# Patient Record
Sex: Female | Born: 1945 | Race: White | Hispanic: No | Marital: Married | Smoking: Former smoker
Health system: Southern US, Community
[De-identification: ages and names within clinical notes are randomized; demographics above are authoritative.]

## PROBLEM LIST (undated history)

## (undated) DIAGNOSIS — R0602 Shortness of breath: Secondary | ICD-10-CM

## (undated) DIAGNOSIS — I10 Essential (primary) hypertension: Secondary | ICD-10-CM

## (undated) DIAGNOSIS — C189 Malignant neoplasm of colon, unspecified: Secondary | ICD-10-CM

## (undated) DIAGNOSIS — C55 Malignant neoplasm of uterus, part unspecified: Secondary | ICD-10-CM

## (undated) HISTORY — PX: PARTIAL COLECTOMY: SHX5273

## (undated) HISTORY — PX: ABDOMINAL HYSTERECTOMY: SHX81

## (undated) HISTORY — DX: Essential (primary) hypertension: I10

## (undated) HISTORY — PX: APPENDECTOMY: SHX54

## (undated) HISTORY — PX: TUBAL LIGATION: SHX77

---

## 1998-12-17 ENCOUNTER — Other Ambulatory Visit: Admission: RE | Admit: 1998-12-17 | Discharge: 1998-12-17 | Payer: Self-pay | Admitting: Obstetrics and Gynecology

## 1998-12-29 ENCOUNTER — Inpatient Hospital Stay (HOSPITAL_COMMUNITY): Admission: RE | Admit: 1998-12-29 | Discharge: 1998-12-31 | Payer: Self-pay | Admitting: Obstetrics and Gynecology

## 1999-06-29 ENCOUNTER — Ambulatory Visit (HOSPITAL_COMMUNITY): Admission: RE | Admit: 1999-06-29 | Discharge: 1999-06-29 | Payer: Self-pay | Admitting: Family Medicine

## 1999-06-29 ENCOUNTER — Encounter: Payer: Self-pay | Admitting: Family Medicine

## 2007-01-29 ENCOUNTER — Ambulatory Visit (HOSPITAL_COMMUNITY): Admission: RE | Admit: 2007-01-29 | Discharge: 2007-01-29 | Payer: Self-pay | Admitting: Gastroenterology

## 2007-01-29 ENCOUNTER — Encounter (INDEPENDENT_AMBULATORY_CARE_PROVIDER_SITE_OTHER): Payer: Self-pay | Admitting: Specialist

## 2007-03-21 ENCOUNTER — Encounter (INDEPENDENT_AMBULATORY_CARE_PROVIDER_SITE_OTHER): Payer: Self-pay | Admitting: Surgery

## 2007-03-21 ENCOUNTER — Inpatient Hospital Stay (HOSPITAL_COMMUNITY): Admission: RE | Admit: 2007-03-21 | Discharge: 2007-03-24 | Payer: Self-pay | Admitting: Surgery

## 2007-04-03 ENCOUNTER — Ambulatory Visit: Payer: Self-pay | Admitting: Hematology & Oncology

## 2007-04-19 LAB — COMPREHENSIVE METABOLIC PANEL
ALT: 16 U/L (ref 0–35)
AST: 18 U/L (ref 0–37)
Albumin: 4.4 g/dL (ref 3.5–5.2)
Alkaline Phosphatase: 82 U/L (ref 39–117)
BUN: 5 mg/dL — ABNORMAL LOW (ref 6–23)
CO2: 25 mEq/L (ref 19–32)
Calcium: 9.6 mg/dL (ref 8.4–10.5)
Chloride: 102 mEq/L (ref 96–112)
Creatinine, Ser: 0.84 mg/dL (ref 0.40–1.20)
Glucose, Bld: 87 mg/dL (ref 70–99)
Potassium: 4.1 mEq/L (ref 3.5–5.3)
Sodium: 139 mEq/L (ref 135–145)
Total Bilirubin: 0.5 mg/dL (ref 0.3–1.2)
Total Protein: 7.1 g/dL (ref 6.0–8.3)

## 2007-04-19 LAB — CBC WITH DIFFERENTIAL/PLATELET
BASO%: 0.4 % (ref 0.0–2.0)
Basophils Absolute: 0 10*3/uL (ref 0.0–0.1)
EOS%: 1.6 % (ref 0.0–7.0)
Eosinophils Absolute: 0.1 10*3/uL (ref 0.0–0.5)
HCT: 33.3 % — ABNORMAL LOW (ref 34.8–46.6)
HGB: 11.6 g/dL (ref 11.6–15.9)
LYMPH%: 27.3 % (ref 14.0–48.0)
MCH: 30.2 pg (ref 26.0–34.0)
MCHC: 34.8 g/dL (ref 32.0–36.0)
MCV: 87 fL (ref 81.0–101.0)
MONO#: 0.4 10*3/uL (ref 0.1–0.9)
MONO%: 6.6 % (ref 0.0–13.0)
NEUT#: 4.1 10*3/uL (ref 1.5–6.5)
NEUT%: 64.1 % (ref 39.6–76.8)
Platelets: 414 10*3/uL — ABNORMAL HIGH (ref 145–400)
RBC: 3.82 10*6/uL (ref 3.70–5.32)
RDW: 14 % (ref 11.3–14.5)
WBC: 6.3 10*3/uL (ref 3.9–10.0)
lymph#: 1.7 10*3/uL (ref 0.9–3.3)

## 2007-04-19 LAB — CEA: CEA: 1.4 ng/mL (ref 0.0–5.0)

## 2007-10-26 ENCOUNTER — Ambulatory Visit: Payer: Self-pay | Admitting: Hematology & Oncology

## 2007-10-26 LAB — CBC WITH DIFFERENTIAL/PLATELET
BASO%: 0.8 % (ref 0.0–2.0)
Basophils Absolute: 0.1 10*3/uL (ref 0.0–0.1)
EOS%: 2.1 % (ref 0.0–7.0)
Eosinophils Absolute: 0.2 10*3/uL (ref 0.0–0.5)
HCT: 38.5 % (ref 34.8–46.6)
HGB: 13.1 g/dL (ref 11.6–15.9)
LYMPH%: 35.4 % (ref 14.0–48.0)
MCH: 29 pg (ref 26.0–34.0)
MCHC: 34 g/dL (ref 32.0–36.0)
MCV: 85.1 fL (ref 81.0–101.0)
MONO#: 0.8 10*3/uL (ref 0.1–0.9)
MONO%: 8.9 % (ref 0.0–13.0)
NEUT#: 4.6 10*3/uL (ref 1.5–6.5)
NEUT%: 52.8 % (ref 39.6–76.8)
Platelets: 421 10*3/uL — ABNORMAL HIGH (ref 145–400)
RBC: 4.52 10*6/uL (ref 3.70–5.32)
RDW: 12.9 % (ref 11.3–14.5)
WBC: 8.7 10*3/uL (ref 3.9–10.0)
lymph#: 3.1 10*3/uL (ref 0.9–3.3)

## 2007-10-26 LAB — COMPREHENSIVE METABOLIC PANEL
ALT: 18 U/L (ref 0–35)
AST: 20 U/L (ref 0–37)
Albumin: 4.5 g/dL (ref 3.5–5.2)
Alkaline Phosphatase: 114 U/L (ref 39–117)
BUN: 14 mg/dL (ref 6–23)
CO2: 28 mEq/L (ref 19–32)
Calcium: 9.6 mg/dL (ref 8.4–10.5)
Chloride: 96 mEq/L (ref 96–112)
Creatinine, Ser: 0.81 mg/dL (ref 0.40–1.20)
Glucose, Bld: 94 mg/dL (ref 70–99)
Potassium: 4 mEq/L (ref 3.5–5.3)
Sodium: 138 mEq/L (ref 135–145)
Total Bilirubin: 0.4 mg/dL (ref 0.3–1.2)
Total Protein: 7.2 g/dL (ref 6.0–8.3)

## 2007-10-26 LAB — CEA: CEA: 1.7 ng/mL (ref 0.0–5.0)

## 2007-10-31 ENCOUNTER — Ambulatory Visit (HOSPITAL_COMMUNITY): Admission: RE | Admit: 2007-10-31 | Discharge: 2007-10-31 | Payer: Self-pay | Admitting: Hematology & Oncology

## 2010-10-24 ENCOUNTER — Encounter: Payer: Self-pay | Admitting: Hematology & Oncology

## 2011-02-15 NOTE — Discharge Summary (Signed)
NAME:  Rhonda Ellis, Rhonda Ellis              ACCOUNT NO.:  [id number]   MEDICAL RECORD NO.:  [id number]          PATIENT TYPE:  INP   LOCATION:  1533                         FACILITY:  [facility]   PHYSICIAN:  Ardeth Sportsman, MD     DATE OF BIRTH:  1946/07/30   DATE OF ADMISSION:  03/21/2007  DATE OF DISCHARGE:  03/24/2007                               DISCHARGE SUMMARY   PRIMARY CARE PHYSICIAN:  Marjory Lies, M.D.   PRIMARY GASTROENTEROLOGIST:  Anselmo Rod, M.D.   DIAGNOSIS:  Proximal rectal adenocarcinoma.   PROCEDURE:  Laparoscopic lysis of adhesions and low anterior resection  with splenic flexure mobilization on March 21, 2007.   CLINICAL DATA:  Pathology shows a T3N0 adenocarcinoma, 6 cm maximal  tumor size, moderately differentiated, 0 of 30 lymph nodes.   HOSPITAL COURSE:  Ms. Billiot is a 65 year old female who was found to  have a rectal cancer.  She underwent laparoscopic resection on March 21, 2007.  Postoperatively she progressed rapidly, developing flatus and  tolerating an oral diet.  She did not have any nausea or vomiting.  She  was transitioned over to oral pain medications and was ambulating well.  She had some anemia so she was started on some iron.  Based on these  improvements, we felt it would reasonable for her to be discharged home  with the following instructions.   DISCHARGE INSTRUCTIONS:  1. She is to return to the clinic to see me in about two weeks.  2. She should follow up with her primary care physician, Dr. Doristine Counter.  3. She may have discussion of need for medical oncology consultation      depending/based upon final pathology results.  4. She should call if she has any fevers, chills, sweats, nausea,      vomiting, worsening abdominal pain or discomfort.  5. She should resume her home medications which include:      a.     Iron p.o. b.i.d.      b.     Darvocet-N 100 one to two p.o. q.4-6h. p.r.n. pain.      c.     Loratadine p.r.n.      Ardeth Sportsman, MD  Electronically Signed     SCG/MEDQ  D:  04/23/2007  T:  04/24/2007  Job:  [id number]   cc:   Anselmo Rod, M.D.  Fax: [phone]   Marjory Lies, M.D.  Fax: [phone]

## 2011-02-15 NOTE — Op Note (Signed)
NAME:  Rhonda Ellis, Rhonda Ellis              ACCOUNT NO.:  [id number]   MEDICAL RECORD NO.:  [id number]          PATIENT TYPE:  INP   LOCATION:  1533                         FACILITY:  [facility]   PHYSICIAN:  Ardeth Sportsman, MD     DATE OF BIRTH:  [birth year]   DATE OF PROCEDURE:  03/21/2007  DATE OF DISCHARGE:                               OPERATIVE REPORT   SURGEON:  Ardeth Sportsman, M.D.   ASSISTANT:  Wilmon Arms. Tsuei, M.D.   PREOPERATIVE DIAGNOSIS:  Cancer at the sigmoid/rectal junction.   POSTOPERATIVE DIAGNOSIS:  Cancer at the sigmoid/rectal junction.   PROCEDURE PERFORMED:  1. Laparoscopic lysis of adhesions x60 minutes Dexon.  2. Laparoscopic splenic flexure mobilization of the colon.  3. Laparoscopic anterior resection with 33 EEA stapled anastomosis.  4. Rigid proctoscopy.   SPECIMEN:  Sigmoid colon and proximal rectum.   DRAINS:  None.   ESTIMATED BLOOD LOSS:  150 mL.   COMPLICATIONS:  No major complications.   ANESTHESIA:  1. General anesthesia.  2. Local anesthetic in a field block around all port sites as well as      subfascial block on the extraction site and the low midline.   INDICATIONS:  [name] is a 65 year old female who was found, on  screening colonoscopy, to have a mass 10-15 cm from the anus concerning  for a cancer at the sigmoid rectal junction.  CT scan felt it more  concerning in the distal sigmoid.  The options were discussed and  recommendations made for a segmental colonic resection with low anterior  resection to have adequate distal margins in the mid rectum.  She had no  evidence of any metastatic disease.  It does not seem to be fixed on any  structures, therefore, neoadjuvant chemotherapy did not seem to be  warranted.   The technique of laparoscopically assisted low anterior resection was  discussed.  The risks of stroke, heart attack, deep venous thrombosis,  pulmonary embolus, and death were discussed.  Risks such as bleeding,  transfusion, wound infection, abscess, injury to other organs,  anastomotic leak resulting in ileostomy, incisional hernia, prolonged  pain, and other risks were discussed.  Questions were answered and she  agreed to proceed.   OPERATIVE FINDINGS:  She had a long fibrous dense mass right at the  sigmoid rectal junction, but did not seem to be at any other sidewall  structures.  She had a moderate amount of midline adhesions.  She had no  evidence of any metastatic disease in her peritoneum, liver, or  other  organs.   DESCRIPTION OF PROCEDURE:  Informed consent was confirmed.  The patient  received IV cefoxitin just prior to induction.  She had sequential  compression devices active during the entire case.  She underwent  general anesthesia without difficulty.  She had a Foley catheter  sterilely placed.  She was positioned in low lithotomy with arms tucked  and care was made to try and keep her secure in the bed by lying on a  gel pad.  I went ahead and performed rigid proctoscopy after digital  inspection confirmed that this was proximal to my digit.  I could get at  about 12 cm by rigid proctoscopy.  I did encounter the start of a  proximal rectal mass.   Entry was gained in the abdomen with the patient in steep reversed  Trendelenburg right side up, using a 5 degree scope through a stab  incision in the right upper quadrant.  Careful inspection revealed a  moderate amount of abdominal adhesions.  I was able to place 5 mm ports  in the right mid abdomen and the left mid abdomen.  Later, a 12 mm port  was placed in the right lower quadrant and a 5 mm port was placed  through the umbilicus.  Sharp dissection was done with only very little  focus cautery to remove the numerous adhesions on the parietal  peritoneum of primarily omentum to her midline incision.  She also had  some adhesions of small bowel down in the pelvis from her prior  hysterectomy and appendectomy, but with careful  sharp dissection, I was  ultimately able to free all that up and reduce the small bowel out in  the abdomen.  She also had some attachments to her sigmoid colon toward  the lateral sidewall, but I left those intact to start.   The mesentery of the sigmoid colon was elevated anteriorly to help  identify the inferior mesenteric artery.  The mesentery of the sigmoid  colon was scored at the sacral promontory and followed slightly distally  and proximally over to the ligament of Treitz, taking care to avoid  injury to small bowel.  The sigmoid mesentery was elevated anteriorly to  get a mesenteric window.  The left ureter, gonadal vessels, and iliac  structures could easily be seen and these were left posterior and  preserved at all times.  Blunt dissection was done to help free the  mesentery off the retroperitoneal structures along the Toldt's fascia.  This was carried down towards the rectum and proximally towards the  splenic flexure.  The inferior mesenteric artery and vein could be  isolated, skeletonized, and ligated using a LigaSure within 2 cm from  the abdominal aorta and IVC, respectively.   The colon was freed from its lateral side attachments. Since medial and  lateral dissection had been done, lateral medial mobilization was  completed to help free it off its lateral sidewall attachments all the  way up to the splenic flexure.  The distal half of the transverse colon  was freed off the greater omentum and also splenocolic attachments were  freed off, as well, to provide excellent mobilization.  The spleen and  stomach were observed and protected at all times.  This provided good  mobilization.   Dissection was done just anterior to the sacrum for a total mesorectal  excision of the proximal half of the rectum, initially was done  posteriorly and then also took the lateral sidewall stocks, taking care  to avoid injury to ureter or other structures.  Dissection was carried   anteriorly and initially done sharply to help make sure that the  anterior wall if the rectum was free away from the cuff.  With this  dissection, the mesorectum was skeletonized and ligated and transection  was made using a laparoscopic stapler, primarily done with one full  staple load and then one extra staple just at the corner of the rectum  with a good result.   Capnoperitoneum was completely evacuated through the ports.  A  5 mL low  midline incision was made through a prior low midline incision and a  wound protector was placed.  The proximal rectum and sigmoid colon was  completely eviscerated with a good result.  The mass could easily be  felt and was about 3 cm proximal to the distal margin.  The inferior  mesenteric artery and vein were then taken.  The mesentery was taken in  a ray-like fashion in the distal descending colon and the colon was  transected and sent with the proximal side open.  EEA sizers were used  to size the proximal end of the descending colon and it barely allowed a  33.  A 33 EEA stapler was opened up and the anvil was placed in the  proximal end and a circumferential purse-string with 0 Prolene was done  not tied down.  Some large epiploic appendices were carefully  skeletonized off to have not over crowd the anvil.  The colon was  reduced back into the abdomen.  The wound protector was clamped off and  capnoperitoneum was reintroduced.  Inspection was done with no evidence  of any active bleeding and the mesentery seemed to lay well and the  anvil came easily down into the rectum without any tension.   Dr. Corliss Skains transitioned to the perineum.  After some gentle finger  dilation of the anus, the EEA stapler was advanced up into the rectal  stump.  Inspection was done sterilely of the vaginal wall cuff to make  sure that the vaginal cuff was appropriately dissected away with no  evidence of any leak or injury and it did not.  The spike was brought   through the rectal stump.  The anvil of the proximal descending colon  was attached to the stump and the stapler was fired and a 33 EEA  anastomosis was created.  Anastomotic rings were inspected and were  excellent and the distal ring was sent for the new distal margin.  Leak  test was tested, initially there were a little bit of bubbles when  starting to insufflate, but after reirrigation and reinspection, after  three more tests, there was no evidence of any leak or bubbles.  The  staple line could be seen by rigid proctoscopy by Dr. Corliss Skains with only  minimal blood but no evidence of any active bleeding.  Visual inspection  was done of the anastomosis circumferentially and, again, there was no  evidence of any leak or any abnormality.  The mesentery laid well.   Copious irrigation with 2 liters was done with a nice clear return.  There was no evidence of any active bleeding.  Capnoperitoneum was  evacuated.  The fascial defect was closed using #1 running PDS with a  good result.  The skin was closed using 4-0 Monocryl at all sites.  A  sterile dressing was applied.  The patient was extubated and sent to the  recovery room in stable condition.   I explained the operative findings to the patient's family.  Postoperative goals and instructions were discussed in detail.  They  expressed understanding and appreciation.      Ardeth Sportsman, MD  Electronically Signed     SCG/MEDQ  D:  03/21/2007  T:  03/21/2007  Job:  [id number]   cc:   Marjory Lies, M.D.  Fax: [phone]   JXBJYN WGN FAOZ, M.D.  Fax: [phone]

## 2011-02-18 NOTE — Op Note (Signed)
NAME:  Rhonda Ellis, Rhonda Ellis              ACCOUNT NO.:  [id number]   MEDICAL RECORD NO.:  [id number]          PATIENT TYPE:  AMB   LOCATION:  ENDO                         FACILITY:  [facility]   PHYSICIAN:  Anselmo Rod, M.D.  DATE OF BIRTH:  04-28-1946   DATE OF PROCEDURE:  01/29/2007  DATE OF DISCHARGE:                               OPERATIVE REPORT   PROCEDURE PERFORMED:  Colonoscopy with multiple cold biopsies.   ENDOSCOPIST:  Anselmo Rod, M.D.   INSTRUMENT USED:  Pentax video colonoscope.   INDICATIONS FOR PROCEDURE:  A 65 year old white female with a history of  change in bowel habits, blood in stool and family history of colon  cancer undergoing screening colonoscopy.  Rule out colonic polyps,  masses, etc.   PREPROCEDURE PREPARATION:  Informed consent was procured from the  patient.  The patient fasted for 4 hours prior to the procedure and  prepped with 20 Osmoprep pills the night of and 12 Osmoprep pills the  morning of the procedure.  Risks and benefits of the procedure including  a 10% miss rate of cancer and polyp were discussed with the patient as  well.   PREPROCEDURE PHYSICAL:  VITAL SIGNS:  The patient had stable vital  signs.  NECK:  Supple.  CHEST:  Clear to auscultation.  HEART:  S1 and S2 regular.  ABDOMEN:  Soft with normal bowel sounds.   DESCRIPTION OF PROCEDURE:  The patient was placed in the left lateral  decubitus position and sedated with 100 mcg of Fentanyl and 10 mg of  Versed given intravenously in slow incremental doses. Once the patient  was adequately sedated and maintained on low-flow oxygen and continuous  cardiac monitoring, the Pentax video colonoscope was advanced from the  rectum to the cecum.  A large apple core lesion was seen at 10 cm and  extended up to 15 cm. Multiple biopsies were done. There was scattered  diverticulosis noted as well.  Retroflexion in the rectum revealed no  abnormalities.  No other masses or polyps were  identified.  The patient  tolerated the procedure well without complications.   IMPRESSION:  1. Large mass seen at 10-15 cm. Multiple biopsies done.      ?adenocarcinoma.  2. Scattered diverticulosis.  3. No other masses or polyps seen.   RECOMMENDATIONS:  1. Await pathology results.  2. Check CEA levels.  3. Avoid all nonsteroidals including aspirin for the next two weeks.  4. CT scan of the abdomen and pelvis to be done today.  5. Outpatient follow-up within the next week for further      recommendations.      Anselmo Rod, M.D.  Electronically Signed     JNM/MEDQ  D:  01/29/2007  T:  01/29/2007  Job:  [id number]   cc:   Rema Fendt, P.A.

## 2011-07-20 LAB — COMPREHENSIVE METABOLIC PANEL
ALT: 56 — ABNORMAL HIGH
AST: 43 — ABNORMAL HIGH
Albumin: 4
Alkaline Phosphatase: 100
BUN: 6
CO2: 33 — ABNORMAL HIGH
Calcium: 10.2
Chloride: 103
Creatinine, Ser: 1.02
GFR calc Af Amer: >60
GFR calc non Af Amer: 55 — ABNORMAL LOW
Glucose, Bld: 102 — ABNORMAL HIGH
Potassium: 4.3
Sodium: 145
Total Bilirubin: 1
Total Protein: 7.3

## 2011-07-20 LAB — CBC
HCT: 24.2 — ABNORMAL LOW
HCT: 26.2 — ABNORMAL LOW
Hemoglobin: 8.4 — ABNORMAL LOW
Hemoglobin: 9 — ABNORMAL LOW
MCHC: 34.3
MCHC: 34.6
MCV: 86.3
MCV: 86.3
Platelets: 287
Platelets: 338
RBC: 2.8 — ABNORMAL LOW
RBC: 3.04 — ABNORMAL LOW
RDW: 12.4
RDW: 12.5
WBC: 10
WBC: 11.9 — ABNORMAL HIGH

## 2011-07-20 LAB — HEMOGLOBIN AND HEMATOCRIT, BLOOD
HCT: 42.4
Hemoglobin: 14.2

## 2011-07-20 LAB — CREATININE, SERUM
Creatinine, Ser: 0.87
GFR calc Af Amer: >60
GFR calc non Af Amer: >60

## 2011-07-20 LAB — POTASSIUM: Potassium: 4.3

## 2012-12-24 ENCOUNTER — Encounter: Payer: Self-pay | Admitting: Hematology & Oncology

## 2013-01-08 ENCOUNTER — Encounter (INDEPENDENT_AMBULATORY_CARE_PROVIDER_SITE_OTHER): Payer: Self-pay

## 2013-07-16 NOTE — H&P (Signed)
  History of Present Illness  The patient is a 67 year old female who presents today for follow up of their back. The patient is being followed for their low back pain and she is here to discuss her MRI lumbar Spine back pain. The patient states that they are doing poorly ("feels no better at all"). The following medication has been used for pain control: Tramadol 50mg  bid. The patient reports their current pain level to be moderate to severe and 7 / 10. The patient presents today following MRI. The patient indicates that they have questions or concerns today regarding pain and activity.   Subjective Transcription  The patient returns today for follow up. I last saw her on 06/24/13. At that time she had about a two and a half month history of progressive debilitating back, buttock, and right leg pain. This has persisted.    The patient has had previous IM Toradol, physical therapy, activity modifications, and she declined the Dosepak. As a result of the ongoing pain they ordered the MRI.   Allergies No Known Drug Allergies. 06/24/2013    Social History Marital status. married Living situation. live with spouse Number of flights of stairs before winded. 1 Tobacco use. former smoker Pain Contract. no Illicit drug use. no Current work status. working full time Children. 3 Drug/Alcohol Rehab (Currently). no Exercise. Exercises weekly; does running / walking Drug/Alcohol Rehab (Previously). no    Medication History TraMADol HCl (50MG  Tablet, Oral) Active. (q 4 hrs) Hydrocodone-Acetaminophen (5-325MG  Tablet, Oral) Active. (could not take) Azithromycin (250MG  Tablet, Oral) Active. (has Rx to get filled prn from PCP) Ambien (10MG  Tablet, Oral) Active. (1/4 tab prn sleep) Medications Reconciled.    Objective Transcription  On clinical exam she has a positive straight leg raise test. She has trace weakness of the EHL and tibialis anterior. No  gastrocnemius weakness. She has symmetrical deep tendon reflexes. Moderate back pain, but severe numbness and pain in the L5 distribution. No SOB/CP. Abd soft/NT no history of incontinence of B/B.  Pulses 2+ and symmetric.  compartments soft/nt.    RADIOGRAPHS:  The MRI from 07/02/13 demonstrates a right sided disc herniation causing significant mass effect on the right traversing L5 nerve root. There are some minor degenerative changes throughout the lumbar spine, but the L4-5 pathology is the principle source.   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 Given the duration of her pain, the trace weakness and numbness, I think surgical intervention is advisable. The risks of that include infection, bleeding, nerve damage, death, stoke, paralysis, failure to heal, need for further surgery, ongoing or worse pain, recurrent disc herniation, loss of bowel and bladder control, blood clots, adjacent segment disease. We will obtain preoperative medical clearance. Once we have that we will plan on [facility]. We will plan on a 23 hour admission. If she is doing well then she can go home the same day.

## 2013-07-19 ENCOUNTER — Encounter (HOSPITAL_COMMUNITY): Payer: Self-pay | Admitting: Pharmacy Technician

## 2013-07-22 ENCOUNTER — Encounter (HOSPITAL_COMMUNITY)
Admission: RE | Admit: 2013-07-22 | Discharge: 2013-07-22 | Disposition: A | Discharge status: Home / Self Care | Payer: BC Managed Care – PPO | Source: Ambulatory Visit | Attending: Anesthesiology | Admitting: Anesthesiology

## 2013-07-22 ENCOUNTER — Encounter (HOSPITAL_COMMUNITY)
Admission: RE | Admit: 2013-07-22 | Discharge: 2013-07-22 | Disposition: A | Discharge status: Home / Self Care | Payer: BC Managed Care – PPO | Source: Ambulatory Visit | Attending: Orthopedic Surgery | Admitting: Orthopedic Surgery

## 2013-07-22 ENCOUNTER — Encounter (HOSPITAL_COMMUNITY): Payer: Self-pay

## 2013-07-22 HISTORY — DX: Malignant neoplasm of uterus, part unspecified: C55

## 2013-07-22 HISTORY — DX: Shortness of breath: R06.02

## 2013-07-22 HISTORY — DX: Malignant neoplasm of colon, unspecified: C18.9

## 2013-07-22 LAB — CBC
HCT: 40.5 % (ref 36.0–46.0)
Hemoglobin: 13.6 g/dL (ref 12.0–15.0)
MCH: 29.1 pg (ref 26.0–34.0)
MCHC: 33.6 g/dL (ref 30.0–36.0)
MCV: 86.7 fL (ref 78.0–100.0)
Platelets: 306 10*3/uL (ref 150–400)
RBC: 4.67 MIL/uL (ref 3.87–5.11)
RDW: 12.6 % (ref 11.5–15.5)
WBC: 9.4 10*3/uL (ref 4.0–10.5)

## 2013-07-22 LAB — BASIC METABOLIC PANEL
BUN: 9 mg/dL (ref 6–23)
CO2: 28 mEq/L (ref 19–32)
Calcium: 9.8 mg/dL (ref 8.4–10.5)
Chloride: 94 mEq/L — ABNORMAL LOW (ref 96–112)
Creatinine, Ser: 0.78 mg/dL (ref 0.50–1.10)
GFR calc Af Amer: >90 mL/min (ref >90)
GFR calc non Af Amer: 85 mL/min — ABNORMAL LOW (ref >90)
Glucose, Bld: 94 mg/dL (ref 70–99)
Potassium: 3.4 mEq/L — ABNORMAL LOW (ref 3.5–5.1)
Sodium: 131 mEq/L — ABNORMAL LOW (ref 135–145)

## 2013-07-22 LAB — SURGICAL PCR SCREEN
MRSA, PCR: NEGATIVE
Staphylococcus aureus: NEGATIVE

## 2013-07-22 NOTE — Progress Notes (Addendum)
Anesthesia PAT Evaluation:  Patient is a 67 year old female scheduled for right L4-5 discectomy on 07/24/13 by Dr. Shon Baton.  History includes former smoker, uterine and colon (sigmoing/rectal junction) cancer, hysterectomy > 10 years ago, partial colectomy '08, appendectomy.  PCP is Mady Gemma, PA-C with [facility].  Her sister-in-law works in Museum/gallery curator, and she has been going there for years.  Meds: MVI, Ultram, Ambien, ibuprofen, grape seed extract.  I was asked to evaluate patient during her PAT visit due to history of chronic DOE that had intermittently been associated with brief, sharp, chest pain.  These symptoms have been going on for at least a year--maybe longer.  She says that prior to 2-3 months ago, she was walking 2-3X/week for at least twenty minutes. She would get SOB only when going up hill, which had not changed in years.  Less than 50% of the time, she would notice a sharp, 2-3/10 mid chest pain that would last for < 10 seconds.  It was not severe enough that she felt she needed to stop walking and the pain would go away on its own. There was no associated radiation, diaphoresis, palpitations, pre-syncope, nausea.  She denies any SOB at rest, edema, or known heart issues.  She has been unable to walk long distances or work in [employer] now due to severe back and RLE pain.    Exam shows a pleasant, Caucasian female, in NAD.  BMI 25.6.  Heart RRR, no murmur noted.  Lungs sounds ar clear.  No carotid bruit noted.  No significant LE edema noted.  EKG on 07/10/13 showed SR, poor r wave progression (possible anterior infarct, age undetermined), possible inferior infarct, age undetermined. R wave progression in V4-5 is worse since 03/19/07 (could consider lead placement), otherwise inferior leads are similar.   Preoperative CXR and labs noted.  Patient's symptoms have been present for at least a year.  Symptoms are somewhat atypical in that pain lasts for < 10  seconds and does not require her to rest to dissipate.  Frequency is also not consistent.  I called and spoke with Mady Gemma, PA-C regarding above symptoms.  She reports that she saw patient earlier this month for preoperative clearance with EKG.  Visit and EKG were reviewed with her supervision physician, and patient was felt "ok" to proceed.  She did not recommend additional preoperative testing if EKGs appear stable. I reviewed above including EKGs with anesthesiologist Dr. Michelle Piper.  If no acute or progressive symptoms then it is anticipated that she can proceed as planned.    Velna Ochs [facility]Anesthesiology Phone [phone] 07/22/2013 5:45 PM

## 2013-07-22 NOTE — Progress Notes (Signed)
Patient reports that she has exertional shortness of breath when climbing a hill and intermittent chest pain with same. Advised that she just had an EKG ~ 1 week ago at [facility]. Advised they cleared her for surgery. Rhonda Ellis, [place] notified requested EKG from [facility].

## 2013-07-22 NOTE — Pre-Procedure Instructions (Signed)
Rhonda Ellis  07/22/2013   Your procedure is scheduled on:  October 22  Report to [facility] Entr[facility] "A" [address] at 09:00 AM.  Call this number if you have problems the morning of surgery: [phone]   Remember:   Do not eat food or drink liquids after midnight.   Take these medicines the morning of surgery with A SIP OF WATER: Tramadol (if needed)   STOP Grape Seed Extract, Ibuprofen, and Multiple Vitamins today  Do not take Aspirin, Aleve, Naproxen, Advil, Ibuprofen, Vitamin, Herbs, or Supplements starting today  Do not wear jewelry, make-up or nail polish.  Do not wear lotions, powders, or perfumes. You may wear deodorant.  Do not shave 48 hours prior to surgery. Men may shave face and neck.  Do not bring valuables to the hospital.  [facility] is not responsible                  for any belongings or valuables.               Contacts, dentures or bridgework may not be worn into surgery.  Leave suitcase in the car. After surgery it may be brought to your room.  For patients admitted to the hospital, discharge time is determined by your                treatment team.              Special Instructions: Shower using CHG 2 nights before surgery and the night before surgery.  If you shower the day of surgery use CHG.  Use special wash - you have one bottle of CHG for all showers.  You should use approximately 1/3 of the bottle for each shower.   Please read over the following fact sheets that you were given: Pain Booklet, Coughing and Deep Breathing and Surgical Site Infection Prevention

## 2013-07-23 MED ORDER — CEFAZOLIN SODIUM-DEXTROSE 2-3 GM-% IV SOLR
2.0000 g | INTRAVENOUS | Status: AC
  Administered 2013-07-24: 2 g via INTRAVENOUS
  Filled 2013-07-23: qty 50

## 2013-07-24 ENCOUNTER — Encounter (HOSPITAL_COMMUNITY): Payer: BC Managed Care – PPO | Admitting: Vascular Surgery

## 2013-07-24 ENCOUNTER — Ambulatory Visit (HOSPITAL_COMMUNITY): Payer: BC Managed Care – PPO

## 2013-07-24 ENCOUNTER — Encounter (HOSPITAL_COMMUNITY): Payer: Self-pay | Admitting: *Deleted

## 2013-07-24 ENCOUNTER — Ambulatory Visit (HOSPITAL_COMMUNITY): Payer: BC Managed Care – PPO | Admitting: *Deleted

## 2013-07-24 ENCOUNTER — Encounter (HOSPITAL_COMMUNITY)
Admission: RE | Disposition: A | Discharge status: Home / Self Care | Payer: Self-pay | Source: Ambulatory Visit | Attending: Orthopedic Surgery

## 2013-07-24 ENCOUNTER — Observation Stay (HOSPITAL_COMMUNITY)
Admission: RE | Admit: 2013-07-24 | Discharge: 2013-07-25 | Disposition: A | Discharge status: Home / Self Care | Payer: BC Managed Care – PPO | Source: Ambulatory Visit | Attending: Orthopedic Surgery | Admitting: Orthopedic Surgery

## 2013-07-24 DIAGNOSIS — M5126 Other intervertebral disc displacement, lumbar region: Principal | ICD-10-CM | POA: Insufficient documentation

## 2013-07-24 DIAGNOSIS — Z01812 Encounter for preprocedural laboratory examination: Secondary | ICD-10-CM | POA: Insufficient documentation

## 2013-07-24 DIAGNOSIS — Z01818 Encounter for other preprocedural examination: Secondary | ICD-10-CM | POA: Insufficient documentation

## 2013-07-24 DIAGNOSIS — Z9889 Other specified postprocedural states: Secondary | ICD-10-CM

## 2013-07-24 HISTORY — PX: LAMINOTOMY: SHX998

## 2013-07-24 HISTORY — PX: LUMBAR LAMINECTOMY/DECOMPRESSION MICRODISCECTOMY: SHX5026

## 2013-07-24 SURGERY — LUMBAR LAMINECTOMY/DECOMPRESSION MICRODISCECTOMY 1 LEVEL
Anesthesia: General | Site: Spine Lumbar | Laterality: Right | Wound class: Clean

## 2013-07-24 MED ORDER — METHOCARBAMOL 100 MG/ML IJ SOLN
500.0000 mg | Freq: Four times a day (QID) | INTRAVENOUS | Status: DC | PRN
  Filled 2013-07-24: qty 5

## 2013-07-24 MED ORDER — KETOROLAC TROMETHAMINE 30 MG/ML IJ SOLN
INTRAMUSCULAR | Status: AC
  Filled 2013-07-24: qty 1

## 2013-07-24 MED ORDER — ROCURONIUM BROMIDE 100 MG/10ML IV SOLN
INTRAVENOUS | Status: DC | PRN
  Administered 2013-07-24: 40 mg via INTRAVENOUS

## 2013-07-24 MED ORDER — HYDROMORPHONE HCL PF 1 MG/ML IJ SOLN
INTRAMUSCULAR | Status: AC
  Administered 2013-07-24: 0.5 mg via INTRAVENOUS
  Filled 2013-07-24: qty 1

## 2013-07-24 MED ORDER — DEXAMETHASONE SODIUM PHOSPHATE 4 MG/ML IJ SOLN
4.0000 mg | Freq: Once | INTRAMUSCULAR | Status: AC
  Administered 2013-07-24: 4 mg via INTRAVENOUS

## 2013-07-24 MED ORDER — SODIUM CHLORIDE 0.9 % IV SOLN: 250.0000 mL | INTRAVENOUS | Status: DC

## 2013-07-24 MED ORDER — GLYCOPYRROLATE 0.2 MG/ML IJ SOLN
INTRAMUSCULAR | Status: DC | PRN
  Administered 2013-07-24: 0.6 mg via INTRAVENOUS

## 2013-07-24 MED ORDER — MIDAZOLAM HCL 5 MG/5ML IJ SOLN
INTRAMUSCULAR | Status: DC | PRN
  Administered 2013-07-24: 2 mg via INTRAVENOUS

## 2013-07-24 MED ORDER — HYDROMORPHONE HCL PF 1 MG/ML IJ SOLN
0.2500 mg | INTRAMUSCULAR | Status: DC | PRN
Start: 2013-07-24 — End: 2013-07-24
  Administered 2013-07-24 (×2): 0.5 mg via INTRAVENOUS

## 2013-07-24 MED ORDER — POLYETHYLENE GLYCOL 3350 17 GM/SCOOP PO POWD: 17.0000 g | Freq: Every day | ORAL | Status: DC

## 2013-07-24 MED ORDER — NEOSTIGMINE METHYLSULFATE 1 MG/ML IJ SOLN
INTRAMUSCULAR | Status: DC | PRN
  Administered 2013-07-24: 3 mg via INTRAVENOUS

## 2013-07-24 MED ORDER — LACTATED RINGERS IV SOLN
INTRAVENOUS | Status: DC
  Administered 2013-07-24: 10:00:00 via INTRAVENOUS

## 2013-07-24 MED ORDER — DEXAMETHASONE SODIUM PHOSPHATE 4 MG/ML IJ SOLN
4.0000 mg | Freq: Four times a day (QID) | INTRAMUSCULAR | Status: DC
  Filled 2013-07-24 (×7): qty 1

## 2013-07-24 MED ORDER — SODIUM CHLORIDE 0.9 % IJ SOLN: 3.0000 mL | INTRAMUSCULAR | Status: DC | PRN

## 2013-07-24 MED ORDER — KETOROLAC TROMETHAMINE 30 MG/ML IJ SOLN
15.0000 mg | Freq: Once | INTRAMUSCULAR | Status: AC | PRN
  Administered 2013-07-24: 30 mg via INTRAVENOUS

## 2013-07-24 MED ORDER — METHOCARBAMOL 500 MG PO TABS: 500.0000 mg | ORAL_TABLET | Freq: Three times a day (TID) | ORAL | Status: DC | PRN

## 2013-07-24 MED ORDER — ZOLPIDEM TARTRATE 5 MG PO TABS: 5.0000 mg | ORAL_TABLET | Freq: Every evening | ORAL | Status: DC | PRN

## 2013-07-24 MED ORDER — CEFAZOLIN SODIUM 1-5 GM-% IV SOLN
1.0000 g | Freq: Three times a day (TID) | INTRAVENOUS | Status: AC
  Administered 2013-07-24 – 2013-07-25 (×2): 1 g via INTRAVENOUS
  Filled 2013-07-24 (×2): qty 50

## 2013-07-24 MED ORDER — ONDANSETRON HCL 4 MG PO TABS: 4.0000 mg | ORAL_TABLET | Freq: Three times a day (TID) | ORAL | Status: DC | PRN

## 2013-07-24 MED ORDER — HEMOSTATIC AGENTS (NO CHARGE) OPTIME
TOPICAL | Status: DC | PRN
  Administered 2013-07-24: 1 via TOPICAL

## 2013-07-24 MED ORDER — ONDANSETRON HCL 4 MG/2ML IJ SOLN
INTRAMUSCULAR | Status: DC | PRN
  Administered 2013-07-24: 4 mg via INTRAVENOUS

## 2013-07-24 MED ORDER — 0.9 % SODIUM CHLORIDE (POUR BTL) OPTIME
TOPICAL | Status: DC | PRN
  Administered 2013-07-24: 1000 mL

## 2013-07-24 MED ORDER — ONDANSETRON HCL 4 MG/2ML IJ SOLN
4.0000 mg | INTRAMUSCULAR | Status: DC | PRN
  Administered 2013-07-24: 4 mg via INTRAVENOUS
  Filled 2013-07-24: qty 2

## 2013-07-24 MED ORDER — HYDROCODONE-ACETAMINOPHEN 10-325 MG PO TABS: 1.0000 | ORAL_TABLET | Freq: Four times a day (QID) | ORAL | Status: DC | PRN

## 2013-07-24 MED ORDER — LACTATED RINGERS IV SOLN
INTRAVENOUS | Status: DC | PRN
  Administered 2013-07-24 (×2): via INTRAVENOUS

## 2013-07-24 MED ORDER — DEXAMETHASONE 4 MG PO TABS
4.0000 mg | ORAL_TABLET | Freq: Four times a day (QID) | ORAL | Status: DC
  Administered 2013-07-24 – 2013-07-25 (×4): 4 mg via ORAL
  Filled 2013-07-24 (×7): qty 1

## 2013-07-24 MED ORDER — THROMBIN 20000 UNITS EX SOLR
CUTANEOUS | Status: AC
  Filled 2013-07-24: qty 20000

## 2013-07-24 MED ORDER — ACETAMINOPHEN 10 MG/ML IV SOLN
1000.0000 mg | Freq: Four times a day (QID) | INTRAVENOUS | Status: AC
  Administered 2013-07-24 – 2013-07-25 (×4): 1000 mg via INTRAVENOUS
  Filled 2013-07-24 (×4): qty 100

## 2013-07-24 MED ORDER — ACETAMINOPHEN 10 MG/ML IV SOLN
1000.0000 mg | Freq: Four times a day (QID) | INTRAVENOUS | Status: DC
  Administered 2013-07-24: 1000 mg via INTRAVENOUS

## 2013-07-24 MED ORDER — PROPOFOL 10 MG/ML IV BOLUS
INTRAVENOUS | Status: DC | PRN
  Administered 2013-07-24: 170 mg via INTRAVENOUS

## 2013-07-24 MED ORDER — OXYCODONE HCL 5 MG PO TABS
10.0000 mg | ORAL_TABLET | ORAL | Status: DC | PRN
  Administered 2013-07-24: 10 mg via ORAL
  Filled 2013-07-24 (×2): qty 2

## 2013-07-24 MED ORDER — ACETAMINOPHEN 10 MG/ML IV SOLN
INTRAVENOUS | Status: AC
  Filled 2013-07-24: qty 100

## 2013-07-24 MED ORDER — LIDOCAINE HCL (CARDIAC) 20 MG/ML IV SOLN
INTRAVENOUS | Status: DC | PRN
  Administered 2013-07-24: 40 mg via INTRAVENOUS

## 2013-07-24 MED ORDER — PHENOL 1.4 % MT LIQD: 1.0000 | OROMUCOSAL | Status: DC | PRN

## 2013-07-24 MED ORDER — DEXAMETHASONE SODIUM PHOSPHATE 4 MG/ML IJ SOLN
INTRAMUSCULAR | Status: AC
  Filled 2013-07-24: qty 1

## 2013-07-24 MED ORDER — MENTHOL 3 MG MT LOZG: 1.0000 | LOZENGE | OROMUCOSAL | Status: DC | PRN

## 2013-07-24 MED ORDER — BUPIVACAINE-EPINEPHRINE PF 0.25-1:200000 % IJ SOLN
INTRAMUSCULAR | Status: AC
  Filled 2013-07-24: qty 30

## 2013-07-24 MED ORDER — PHENYLEPHRINE HCL 10 MG/ML IJ SOLN
INTRAMUSCULAR | Status: DC | PRN
  Administered 2013-07-24 (×2): 40 ug via INTRAVENOUS

## 2013-07-24 MED ORDER — LACTATED RINGERS IV SOLN: INTRAVENOUS | Status: DC

## 2013-07-24 MED ORDER — ONDANSETRON HCL 4 MG/2ML IJ SOLN: 4.0000 mg | Freq: Once | INTRAMUSCULAR | Status: DC | PRN

## 2013-07-24 MED ORDER — METHOCARBAMOL 500 MG PO TABS
500.0000 mg | ORAL_TABLET | Freq: Four times a day (QID) | ORAL | Status: DC | PRN
  Administered 2013-07-24: 500 mg via ORAL
  Filled 2013-07-24: qty 1

## 2013-07-24 MED ORDER — MORPHINE SULFATE 2 MG/ML IJ SOLN
1.0000 mg | INTRAMUSCULAR | Status: DC | PRN
  Administered 2013-07-24: 2 mg via INTRAVENOUS
  Filled 2013-07-24: qty 1

## 2013-07-24 MED ORDER — BUPIVACAINE-EPINEPHRINE 0.25% -1:200000 IJ SOLN
INTRAMUSCULAR | Status: DC | PRN
  Administered 2013-07-24: 10 mL

## 2013-07-24 MED ORDER — THROMBIN 20000 UNITS EX SOLR
CUTANEOUS | Status: DC | PRN
  Administered 2013-07-24: 13:00:00 via TOPICAL

## 2013-07-24 MED ORDER — SODIUM CHLORIDE 0.9 % IJ SOLN: 3.0000 mL | Freq: Two times a day (BID) | INTRAMUSCULAR | Status: DC

## 2013-07-24 MED ORDER — FENTANYL CITRATE 0.05 MG/ML IJ SOLN
INTRAMUSCULAR | Status: DC | PRN
  Administered 2013-07-24: 50 ug via INTRAVENOUS
  Administered 2013-07-24: 100 ug via INTRAVENOUS
  Administered 2013-07-24: 50 ug via INTRAVENOUS
  Administered 2013-07-24: 100 ug via INTRAVENOUS
  Administered 2013-07-24: 50 ug via INTRAVENOUS

## 2013-07-24 SURGICAL SUPPLY — 59 items
BUR EGG ELITE 4.0 (BURR) ×1 IMPLANT
BUR MATCHSTICK NEURO 3.0 LAGG (BURR) IMPLANT
CANISTER SUCTION 2500CC (MISCELLANEOUS) ×2 IMPLANT
CLOTH BEACON ORANGE TIMEOUT ST (SAFETY) ×2 IMPLANT
CLSR STERI-STRIP ANTIMIC 1/2X4 (GAUZE/BANDAGES/DRESSINGS) ×2 IMPLANT
CORDS BIPOLAR (ELECTRODE) ×2 IMPLANT
COVER SURGICAL LIGHT HANDLE (MISCELLANEOUS) ×2 IMPLANT
DRAIN CHANNEL 15F RND FF W/TCR (WOUND CARE) ×1 IMPLANT
DRAPE POUCH INSTRU U-SHP 10X18 (DRAPES) ×2 IMPLANT
DRAPE SURG 17X23 STRL (DRAPES) ×2 IMPLANT
DRAPE U-SHAPE 47X51 STRL (DRAPES) ×2 IMPLANT
DRSG MEPILEX BORDER 4X4 (GAUZE/BANDAGES/DRESSINGS) ×1 IMPLANT
DRSG MEPILEX BORDER 4X8 (GAUZE/BANDAGES/DRESSINGS) ×2 IMPLANT
DURAPREP 26ML APPLICATOR (WOUND CARE) ×2 IMPLANT
ELECT BLADE 4.0 EZ CLEAN MEGAD (MISCELLANEOUS)
ELECT CAUTERY BLADE 6.4 (BLADE) ×2 IMPLANT
ELECT REM PT RETURN 9FT ADLT (ELECTROSURGICAL) ×2
ELECTRODE BLDE 4.0 EZ CLN MEGD (MISCELLANEOUS) IMPLANT
ELECTRODE REM PT RTRN 9FT ADLT (ELECTROSURGICAL) ×1 IMPLANT
EVACUATOR SILICONE 100CC (DRAIN) ×2 IMPLANT
GLOVE BIO SURGEON STRL SZ7 (GLOVE) ×1 IMPLANT
GLOVE BIOGEL PI IND STRL 7.0 (GLOVE) IMPLANT
GLOVE BIOGEL PI IND STRL 8 (GLOVE) ×1 IMPLANT
GLOVE BIOGEL PI IND STRL 8.5 (GLOVE) ×1 IMPLANT
GLOVE BIOGEL PI INDICATOR 7.0 (GLOVE) ×1
GLOVE BIOGEL PI INDICATOR 8 (GLOVE) ×1
GLOVE BIOGEL PI INDICATOR 8.5 (GLOVE) ×1
GLOVE ECLIPSE 8.5 STRL (GLOVE) ×2 IMPLANT
GLOVE ORTHO TXT STRL SZ7.5 (GLOVE) ×2 IMPLANT
GOWN PREVENTION PLUS XXLARGE (GOWN DISPOSABLE) ×2 IMPLANT
GOWN STRL NON-REIN LRG LVL3 (GOWN DISPOSABLE) ×1 IMPLANT
GOWN STRL REIN 2XL XLG LVL4 (GOWN DISPOSABLE) ×2 IMPLANT
GOWN STRL REIN XL XLG (GOWN DISPOSABLE) ×3 IMPLANT
KIT BASIN OR (CUSTOM PROCEDURE TRAY) ×2 IMPLANT
KIT ROOM TURNOVER OR (KITS) ×2 IMPLANT
NDL SPNL 18GX3.5 QUINCKE PK (NEEDLE) ×2 IMPLANT
NEEDLE 22X1 1/2 (OR ONLY) (NEEDLE) ×2 IMPLANT
NEEDLE SPNL 18GX3.5 QUINCKE PK (NEEDLE) ×4 IMPLANT
NS IRRIG 1000ML POUR BTL (IV SOLUTION) ×2 IMPLANT
PACK LAMINECTOMY ORTHO (CUSTOM PROCEDURE TRAY) ×2 IMPLANT
PACK UNIVERSAL I (CUSTOM PROCEDURE TRAY) ×2 IMPLANT
PAD ARMBOARD 7.5X6 YLW CONV (MISCELLANEOUS) ×4 IMPLANT
PATTIES SURGICAL .5 X.5 (GAUZE/BANDAGES/DRESSINGS) ×1 IMPLANT
PATTIES SURGICAL .5 X1 (DISPOSABLE) ×2 IMPLANT
SPONGE SURGIFOAM ABS GEL 100 (HEMOSTASIS) ×1 IMPLANT
SURGIFLO TRUKIT (HEMOSTASIS) ×1 IMPLANT
SUT MON AB 3-0 SH 27 (SUTURE) ×2
SUT MON AB 3-0 SH27 (SUTURE) ×1 IMPLANT
SUT VIC AB 0 CT1 27 (SUTURE) ×2
SUT VIC AB 0 CT1 27XBRD ANBCTR (SUTURE) ×1 IMPLANT
SUT VIC AB 1 CTX 36 (SUTURE) ×2
SUT VIC AB 1 CTX36XBRD ANBCTR (SUTURE) ×2 IMPLANT
SUT VIC AB 2-0 CT1 18 (SUTURE) ×2 IMPLANT
SYR BULB IRRIGATION 50ML (SYRINGE) ×2 IMPLANT
SYR CONTROL 10ML LL (SYRINGE) ×2 IMPLANT
TOWEL OR 17X24 6PK STRL BLUE (TOWEL DISPOSABLE) ×2 IMPLANT
TOWEL OR 17X26 10 PK STRL BLUE (TOWEL DISPOSABLE) ×2 IMPLANT
WATER STERILE IRR 1000ML POUR (IV SOLUTION) ×1 IMPLANT
YANKAUER SUCT BULB TIP NO VENT (SUCTIONS) ×2 IMPLANT

## 2013-07-24 NOTE — Transfer of Care (Signed)
Immediate Anesthesia Transfer of Care Note  Patient: Rhonda Ellis  Procedure(s) Performed: Procedure(s): RIGHT L4-L5 DISCECTOMY 1 LEVEL (Right)  Patient Location: PACU  Anesthesia Type:General  Level of Consciousness: sedated  Airway & Oxygen Therapy: Patient Spontanous Breathing and Patient connected to face mask oxygen  Post-op Assessment: Report given to PACU RN and Post -op Vital signs reviewed and stable  Post vital signs: Reviewed and stable  Complications: No apparent anesthesia complications

## 2013-07-24 NOTE — Anesthesia Preprocedure Evaluation (Addendum)
Anesthesia Evaluation  Patient identified by MRN, date of birth, ID band Patient awake    Reviewed: Allergy & Precautions, H&P , NPO status , Patient's Chart, lab work & pertinent test results, reviewed documented beta blocker date and time   Airway Mallampati: II TM Distance: >3 FB Neck ROM: Full    Dental  (+) Edentulous Upper and Edentulous Lower Dentures given to husband:   Pulmonary shortness of breath,  breath sounds clear to auscultation        Cardiovascular Rhythm:Regular Rate:Normal     Neuro/Psych    GI/Hepatic   Endo/Other    Renal/GU      Musculoskeletal   Abdominal   Peds  Hematology   Anesthesia Other Findings   Reproductive/Obstetrics                          Anesthesia Physical Anesthesia Plan  ASA: II  Anesthesia Plan: General   Post-op Pain Management:    Induction: Intravenous  Airway Management Planned: Oral ETT  Additional Equipment:   Intra-op Plan:   Post-operative Plan: Extubation in OR  Informed Consent: I have reviewed the patients History and Physical, chart, labs and discussed the procedure including the risks, benefits and alternatives for the proposed anesthesia with the patient or authorized representative who has indicated his/her understanding and acceptance.   Dental advisory given  Plan Discussed with: CRNA and Anesthesiologist  Anesthesia Plan Comments:         Anesthesia Quick Evaluation

## 2013-07-24 NOTE — Preoperative (Signed)
Beta Blockers   Reason not to administer Beta Blockers:Not Applicable 

## 2013-07-24 NOTE — H&P (Signed)
No change in clinical exam H+P reviewed  

## 2013-07-24 NOTE — Brief Op Note (Signed)
07/24/2013  1:46 PM  PATIENT:  Rhonda Ellis  67 y.o. female  PRE-OPERATIVE DIAGNOSIS:  RIGHT L4-L5 HNP  POST-OPERATIVE DIAGNOSIS:  RIGHT L4-L5 HNP  PROCEDURE:  Procedure(s): RIGHT L4-L5 DISCECTOMY 1 LEVEL (Right)  SURGEON:  Surgeon(s) and Role:    * Venita Lick, MD - Primary  PHYSICIAN ASSISTANT:   ASSISTANTS: Zonia Kief   ANESTHESIA:   general  EBL:  Total I/O In: 1600 [I.V.:1600] Out: 100 [Blood:100]  BLOOD ADMINISTERED:none  DRAINS: none   LOCAL MEDICATIONS USED:  MARCAINE     SPECIMEN:  No Specimen  DISPOSITION OF SPECIMEN:  N/A  COUNTS:  YES  TOURNIQUET:  * No tourniquets in log *  DICTATION: .Other Dictation: Dictation Number [id number]  PLAN OF CARE: Admit for overnight observation  PATIENT DISPOSITION:  PACU - hemodynamically stable.

## 2013-07-24 NOTE — Addendum Note (Signed)
Addendum created 07/24/13 1534 by Ernest Haber   Modules edited: Anesthesia Medication Administration

## 2013-07-24 NOTE — Anesthesia Postprocedure Evaluation (Signed)
  Anesthesia Post-op Note  Patient: Rhonda Ellis  Procedure(s) Performed: Procedure(s): RIGHT L4-L5 DISCECTOMY 1 LEVEL (Right)  Patient Location: PACU  Anesthesia Type:General  Level of Consciousness: awake, alert  and oriented  Airway and Oxygen Therapy: Patient Spontanous Breathing and Patient connected to nasal cannula oxygen  Post-op Pain: mild  Post-op Assessment: Post-op Vital signs reviewed, Patient's Cardiovascular Status Stable, Respiratory Function Stable, Patent Airway and Pain level controlled  Post-op Vital Signs: stable  Complications: No apparent anesthesia complications

## 2013-07-24 NOTE — Anesthesia Procedure Notes (Signed)
Procedure Name: Intubation Date/Time: 07/24/2013 12:20 PM Performed by: Venita Lick Pre-anesthesia Checklist: Patient identified, Suction available, Emergency Drugs available, Patient being monitored and Timeout performed Patient Re-evaluated:Patient Re-evaluated prior to inductionOxygen Delivery Method: Circle system utilized Preoxygenation: Pre-oxygenation with 100% oxygen Intubation Type: IV induction Ventilation: Mask ventilation without difficulty and Oral airway inserted - appropriate to patient size Laryngoscope Size: Mac and 3 Grade View: Grade I Tube type: Oral Tube size: 7.5 mm Number of attempts: 1 Airway Equipment and Method: Stylet Placement Confirmation: ETT inserted through vocal cords under direct vision,  positive ETCO2 and breath sounds checked- equal and bilateral (DL and ETT placement by Vickki Muff, Scientist, forensic. atrautmatic, easy intubation) Secured at: 21 cm Tube secured with: Tape Dental Injury: Teeth and Oropharynx as per pre-operative assessment

## 2013-07-25 ENCOUNTER — Encounter (HOSPITAL_COMMUNITY): Payer: Self-pay | Admitting: General Practice

## 2013-07-25 NOTE — Progress Notes (Signed)
07/25/13 1400  PT G-Codes **NOT FOR INPATIENT CLASS**  Functional Assessment Tool Used assist level  Functional Limitation Mobility: Walking and moving around  Mobility: Walking and Moving Around Current Status (N8295) CI  Mobility: Walking and Moving Around Goal Status [phone]) CI  Mobility: Walking and Moving Around Discharge Status [phone]) CI  Lurena Joiner B. Davide Risdon, PT, DPT [phone]

## 2013-07-25 NOTE — Op Note (Signed)
NAME:  Rhonda Ellis, Rhonda Ellis              ACCOUNT NO.:  [id number]  MEDICAL RECORD NO.:  [id number]  LOCATION:  5N19C                        FACILITY:  [facility]  PHYSICIAN:  Alvy Beal, MD    DATE OF BIRTH:  08/29/1946  DATE OF PROCEDURE:  07/24/2013 DATE OF DISCHARGE:                              OPERATIVE REPORT   PREOPERATIVE DIAGNOSIS:  L4-5 right disk herniation.  POSTOPERATIVE DIAGNOSIS:  L4-5 right disk herniation.  OPERATIVE PROCEDURE:  Lumbar laminotomy for diskectomy.  CPT code 630.30.  COMPLICATIONS:  None.  CONDITION:  Stable.  INTRAOPERATIVE FINDINGS:  Large right-sided posterolateral disk herniation consistent with preoperative MRI.  No complications.  HISTORY:  This is a very pleasant 67 year old woman has been having severe unrelenting back, buttock, and right leg pain.  Despite appropriate conservative management, she continued to have severe debilitating pain.  As a result, we elected to proceed with the aforementioned procedure.  OPERATIVE NOTE:  The patient was brought to the operating room, placed supine on the operating table.  After successful induction of general anesthesia and endotracheal intubation, TED and SCDs were applied.  She was turned prone onto the Wilson frame and all bony prominences were well padded.  The back was prepped and draped in standard fashion.  A time-out was taken to confirm patient, procedure, and all other pertinent important data.  Once this was done, we then proceeded with surgery.  I identified the L4-5 level and infiltrated the proposed skin incision with 0.25% Marcaine.  I then made a midline incision centered over the L4-5 disk space.  Sharp dissection was carried out down to the deep fascia.  The deep fascia was sharply incised and I stripped the paraspinal muscles using a Cobb elevator to expose the L4 and 5 spinous processes.  At this point, I could visualize the L4-5 interspinous space.  I placed a Penfield 4  underneath the L4 lamina and took an intraoperative x-ray confirming that I was at the appropriate level. Once this was confirmed, I used a 3-mm Kerrison to perform a laminotomy of L4.  I then placed a Taylor retractor along the lateral aspect of the facet joint for exposure.  I then released the ligamentum flavum from the leading edge of the L5 lamina and then expose the underlying thecal sac.  Once the ligamentum flavum was removed, I could visualize the L5-5 nerve root.  It was significantly dorsally displaced the kink.  There was a clear large disk herniation in the lateral compartment.  I mobilized the thecal sac and nerve root to the left, and then used a nerve root retractor to expose the disk and protect the thecal sac.  I used a 15 blade to incise the annulus and then used a combination of pituitary rongeurs, curettes, and Kerrison rongeurs to remove all the herniated disk fragment.  There were 2 very large fragments of disk material that were removed.  I then used a micropituitary rongeur to gently dissect within the disk space itself removing any free fragments of disk that was still within the disk space.  I then swept circumferentially along the under surface of the annulus with a nerve hook to ensure I had all  free fragments of disk material removed.  At this point, I took my [place] passed along the lateral recess superiorly and inferiorly and then underneath the L5 nerve root until it was traced it out of the L5 foramen.  It was adequately decompressed.  There is no nerve root tension at this time.  I then swept my [facility] underneath the thecal sac circumferentially at the level of the disk space confirming that there were no further fragments of disk material and the thecal sac was adequately decompressed.  At this point, I was very pleased with the case.  I obtained hemostasis using bipolar electrocautery.  I irrigated the wound copiously with normal  saline, placed thrombin-soaked Gelfoam patty over the exposed thecal sac.  I then closed the deep fascia with interrupted #1 Vicryl sutures.  I then used 2-0 interrupted Vicryl sutures and a 3-0 Monocryl for the skin.  Steri-Strips and dry dressing were applied.  The patient was extubated, transferred to PACU without incident.  At the end of the case, all needle and sponge counts were correct.  FIRST ASSISTANT:  Genene Churn. Denton Meek.     Alvy Beal, MD     DDB/MEDQ  D:  07/24/2013  T:  07/25/2013  Job:  [id number]

## 2013-07-25 NOTE — Progress Notes (Signed)
Patient up and ambulated to bathroom, voided without difficulty. Incentive spirometer at bedside, using as instructed. Patient resting comfortably without any complaints.

## 2013-07-25 NOTE — Evaluation (Signed)
Occupational Therapy Evaluation and Discharge Patient Details Name: Rhonda Ellis MRN: [id number] DOB: 1946-08-06 Today's Date: 07/25/2013 Time: 1207-1225 OT Time Calculation (min): 18 min  OT Assessment / Plan / Recommendation History of present illness Lumbar laminotomy for diskectomy   Clinical Impression   This 67 yo female admitted with above presents to acute OT with all education completed, will D/C from acute OT.    OT Assessment  Patient does not need any further OT services    Follow Up Recommendations  No OT follow up       Equipment Recommendations  None recommended by OT          Precautions / Restrictions Precautions Precautions: Back Precaution Booklet Issued: Yes (comment) Required Braces or Orthoses: Spinal Brace Spinal Brace: Applied in sitting position Restrictions Weight Bearing Restrictions: No       ADL  Toilet Transfer: Supervision/safety Toilet Transfer Method: Sit to Barista:  (regular height toliet surface) Transfers/Ambulation Related to ADLs: S for all without AD ADL Comments: Husband will A with LB ADLs until she can cross legs to do this for herself. Pt aware of back precautions     Acute Rehab OT Goals Patient Stated Goal: To go home today  Visit Information  Last OT Received On: 07/25/13 Assistance Needed: +1 History of Present Illness: Lumbar laminotomy for diskectomy       Prior Functioning     Home Living Family/patient expects to be discharged to:: Private residence Living Arrangements: Spouse/significant other Available Help at Discharge: Family;Available 24 hours/day Type of Home: House Home Access: Level entry Home Layout: One level Prior Function Level of Independence: Independent Comments: drives, works in Production assistant, 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 Dominant Hand: Right         Vision/Perception Vision - History Patient Visual Report: No change from  baseline   Cognition  Cognition Arousal/Alertness: Awake/alert Behavior During Therapy: WFL for tasks assessed/performed Overall Cognitive Status: Within Functional Limits for tasks assessed    Extremity/Trunk Assessment Upper Extremity Assessment Upper Extremity Assessment: Overall WFL for tasks assessed     Mobility Bed Mobility Bed Mobility: Sit to Sidelying Right Sit to Sidelying Right: 5: Supervision;HOB flat Details for Bed Mobility Assistance: VCs to stay on her side as she laid down Transfers Transfers: Sit to Stand;Stand to Sit Sit to Stand: 5: Supervision Stand to Sit: 5: Supervision           End of Session OT - End of Session Equipment Utilized During Treatment: Back brace Activity Tolerance: Patient tolerated treatment well Patient left: in bed;with call bell/phone within reach;with family/visitor present Nurse Communication:  (IV seems to not be working correctly)  GO Functional Assessment Tool Used: Clinical observation Functional Limitation: Self care Self Care Current Status [phone]): At least 40 percent but less than 60 percent impaired, limited or restricted Self Care Goal Status (B2841): At least 40 percent but less than 60 percent impaired, limited or restricted Self Care Discharge Status [phone]): At least 40 percent but less than 60 percent impaired, limited or restricted   Evette Georges [phone] 07/25/2013, 1:10 PM

## 2013-07-25 NOTE — Progress Notes (Addendum)
Subjective: Doing well. No complaints. Ready to go home.    Objective: Vital signs in last 24 hours: Temp:  [97 F (36.1 C)-98.5 F (36.9 C)] 98.4 F (36.9 C) (10/23 0532) Pulse Rate:  [58-82] 70 (10/23 0532) Resp:  [12-21] 18 (10/23 0532) BP: (111-158)/(49-76) 111/49 mmHg (10/23 0532) SpO2:  [95 %-100 %] 97 % (10/23 0532)  Intake/Output from previous day: 10/22 0701 - 10/23 0700 In: 2895 [P.O.:360; I.V.:2535] Out: 100 [Blood:100] Intake/Output this shift:     Recent Labs  07/22/13 1355  HGB 13.6    Recent Labs  07/22/13 1355  WBC 9.4  RBC 4.67  HCT 40.5  PLT 306    Recent Labs  07/22/13 1355  NA 131*  K 3.4*  CL 94*  CO2 28  BUN 9  CREATININE 0.78  GLUCOSE 94  CALCIUM 9.8   No results found for this basename: LABPT, INR,  in the last 72 hours  Exam: alert and oriented  Neurologically intact.    Assessment/Plan: D/c home today.  F/u in office 2 weeks postop      OWENS,JAMES M 07/25/2013, 7:37 AM     Agree with above Negative st leg raise Ok for d/c to home

## 2013-07-25 NOTE — Evaluation (Addendum)
Physical Therapy Evaluation Patient Details Name: Rhonda Ellis MRN: [id number] DOB: 08/10/1946 Today's Date: 07/25/2013 Time: 6295-2841 PT Time Calculation (min): 14 min  PT Assessment / Plan / Recommendation History of Present Illness  67 y.o. female admitted to [facility] on 07/24/13 s/p lumbar laminectory and discectomy.    Clinical Impression  Pt is POD #1 s/p lumbar spine surgery and is moving well without an assistive device.  She will have her husband's help at home and will not need any therapy f/u or equipment at discharge.   PT to follow acutely for deficits listed below.       PT Assessment  Patient needs continued PT services    Follow Up Recommendations  No PT follow up    Does the patient have the potential to tolerate intense rehabilitation     NA  Barriers to Discharge   None      Equipment Recommendations  None recommended by PT    Recommendations for Other Services   None  Frequency Min 5X/week    Precautions / Restrictions Precautions Precautions: Back Precaution Booklet Issued: Yes (comment) Precaution Comments: OT provided handout, PT reviewed precautions, lifting restrictions brace use, activity progression with pt Required Braces or Orthoses: Spinal Brace Spinal Brace: Applied in sitting position Restrictions Weight Bearing Restrictions: No   Pertinent Vitals/Pain See vitals flow sheet.       Mobility  Bed Mobility Bed Mobility: Rolling Right;Right Sidelying to Sit;Sitting - Scoot to Edge of Bed Rolling Right: 6: Modified independent (Device/Increase time);With rail Right Sidelying to Sit: 6: Modified independent (Device/Increase time);With rails;HOB elevated Sitting - Scoot to Edge of Bed: 6: Modified independent (Device/Increase time);With rail Sit to Sidelying Right: 5: Supervision;HOB flat Details for Bed Mobility Assistance: verbal cues for correct log roll technique.  Mildly reliant on railing for support  Transfers Transfers: Sit to  Stand;Stand to Sit Sit to Stand: 5: Supervision;With upper extremity assist;With armrests;From bed Stand to Sit: 5: Supervision;Without upper extremity assist;With armrests;To bed Details for Transfer Assistance: supervision for safety as pt was slow to transition to and from sitting due to pain Ambulation/Gait Ambulation/Gait Assistance: 5: Supervision Ambulation Distance (Feet): 180 Feet Assistive device: None Ambulation/Gait Assistance Details: supervision for safety due to guarded gait and slower speed Gait Pattern: Within Functional Limits Gait velocity: decreased        PT Diagnosis: Difficulty walking;Abnormality of gait;Generalized weakness;Acute pain  PT Problem List: Decreased strength;Decreased activity tolerance;Decreased balance;Decreased mobility;Decreased knowledge of use of DME;Decreased knowledge of precautions;Pain PT Treatment Interventions: DME instruction;Gait training;Stair training;Functional mobility training;Therapeutic activities;Therapeutic exercise;Balance training;Neuromuscular re-education;Patient/family education;Modalities     PT Goals(Current goals can be found in the care plan section) Acute Rehab PT Goals Patient Stated Goal: to go home PT Goal Formulation: With patient/family Time For Goal Achievement: 08/01/13 Potential to Achieve Goals: Good  Visit Information  Last PT Received On: 07/25/13 Assistance Needed: +1 History of Present Illness: 67 y.o. female admitted [facility] on 07/24/13 s/p lumbar laminectory and discectomy.         Prior Functioning  Home Living Family/patient expects to be discharged to:: Private residence Living Arrangements: Spouse/significant other Available Help at Discharge: Family;Available 24 hours/day Type of Home: House Home Access: Level entry Home Layout: One level Home Equipment: None Prior Function Level of Independence: Independent Comments: drives, works in Doctor, general practice Communication: No  difficulties Dominant Hand: Right    Cognition  Cognition Arousal/Alertness: Awake/alert Behavior During Therapy: WFL for tasks assessed/performed Overall Cognitive Status: Within Functional Limits  for tasks assessed    Extremity/Trunk Assessment Upper Extremity Assessment Upper Extremity Assessment: Defer to OT evaluation Lower Extremity Assessment Lower Extremity Assessment: Generalized weakness (per pt report PTA she had right thigh numbness and weakness.) Cervical / Trunk Assessment Cervical / Trunk Assessment: Normal      End of Session PT - End of Session Equipment Utilized During Treatment: Back brace Activity Tolerance: Patient limited by pain Patient left: in bed;with call bell/phone within reach;Other (comment) (with OT) Nurse Communication: Mobility status    Rhonda Ellis B. Harol Shabazz, PT, DPT [phone]   07/25/2013, 2:37 PM

## 2013-07-25 NOTE — Discharge Summary (Signed)
  ABBREVIATED DISCHARGE SUMMARY      DATE OF HOSPITALIZATION:  24 Jul 2013  REASON FOR HOSPITALIZATION:  67 yo wf with L4-5 HNP, back pain and worsening LE radiculopathy.      SIGNIFICANT FINDINGS:  HNP  OPERATION: L4-5 microdiscectomy  FINAL DIAGNOSIS  same    SECONDARY DIAGNOSIS: none  CONSULTANTS:  none  DISCHARGE CONDITION:  STABLE  DISCHARGED TO:  HOME

## 2013-07-26 NOTE — Discharge Summary (Signed)
Agree with above Ok for d/c to home

## 2014-04-12 IMAGING — CR DG CHEST 2V
2 series · 2 of 2 positions shown · non-contrast
Comparison: Chest radiograph 03/19/2007

CLINICAL DATA: Preadmission lumbar spine surgery

EXAM:
CHEST  2 VIEW

[w chest pa]
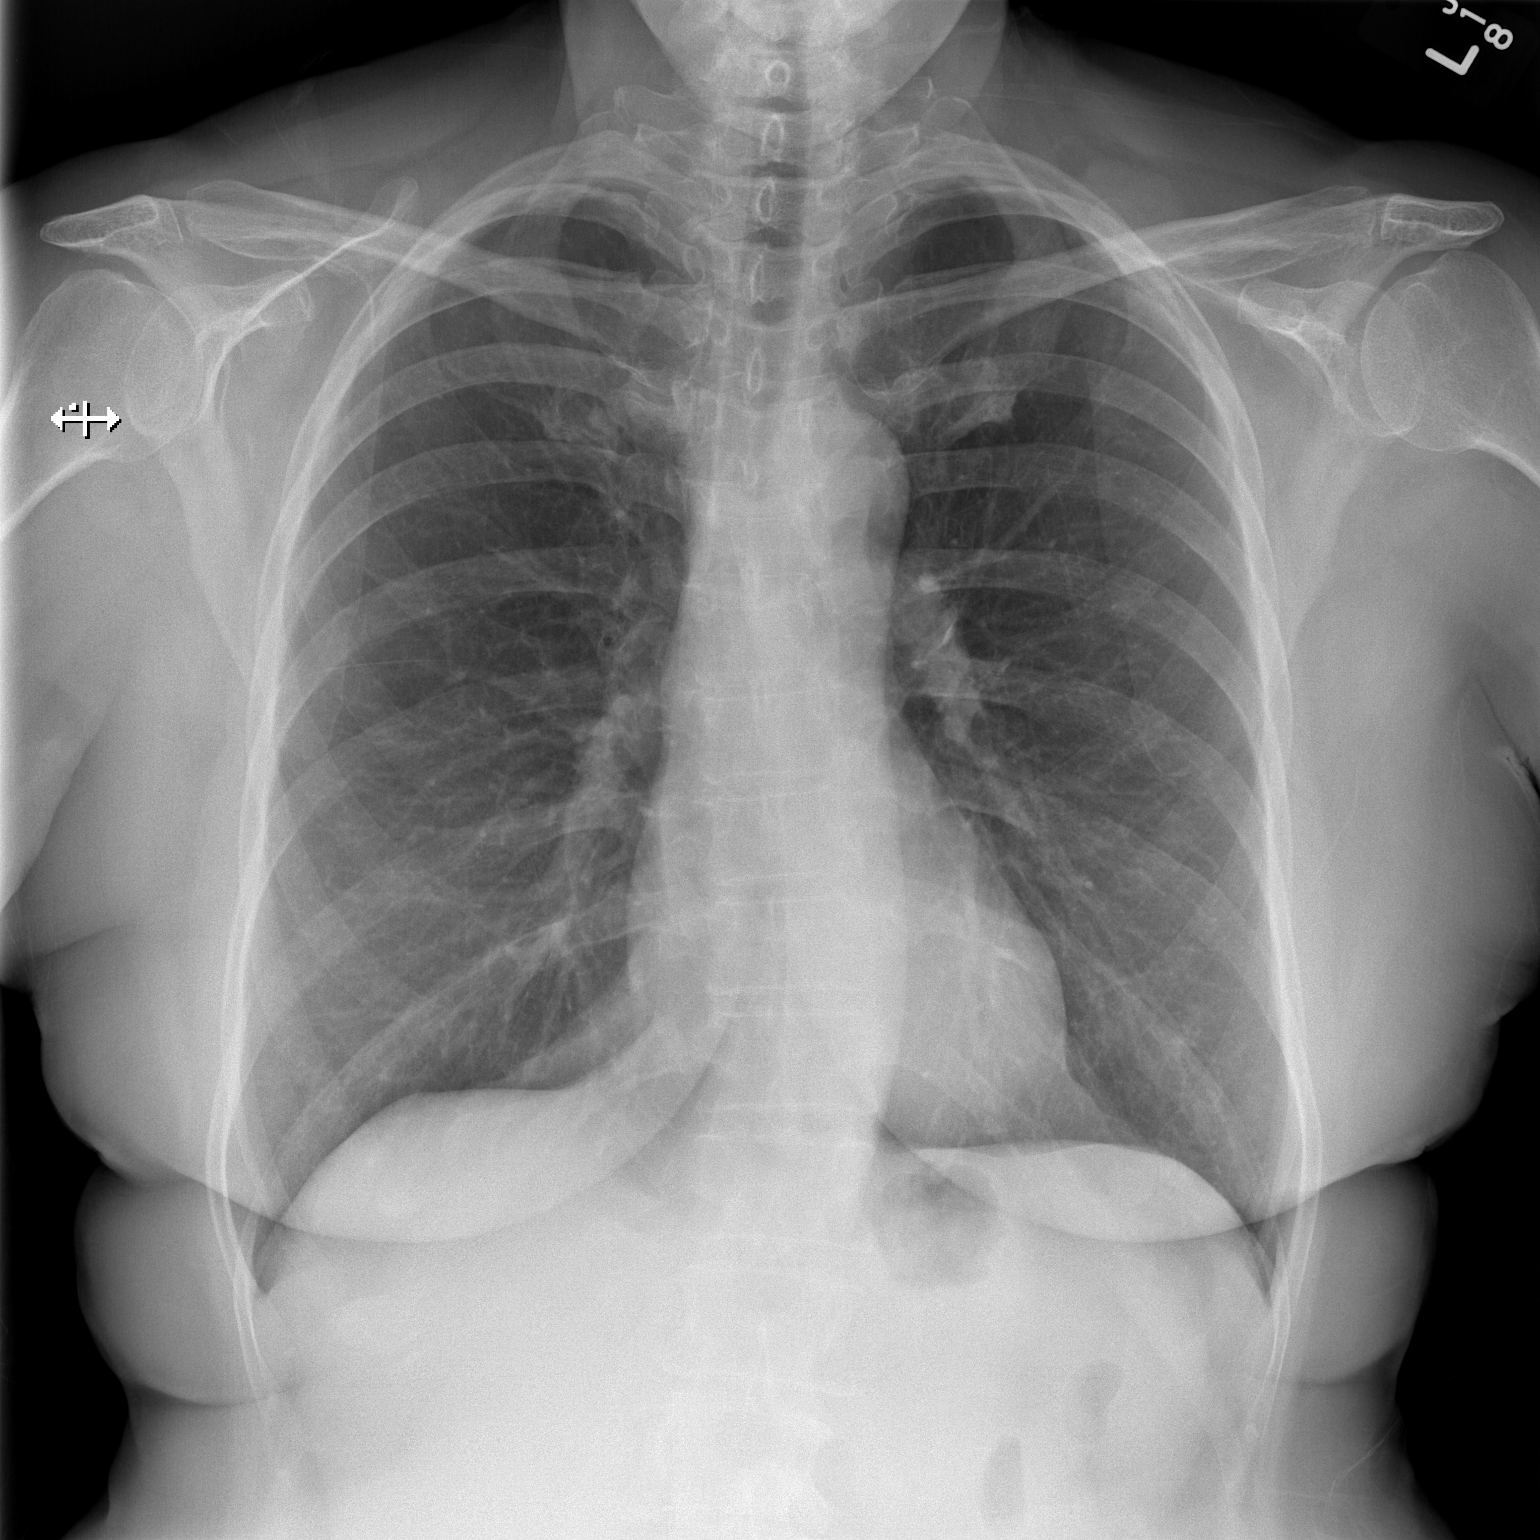

[w chest lat]
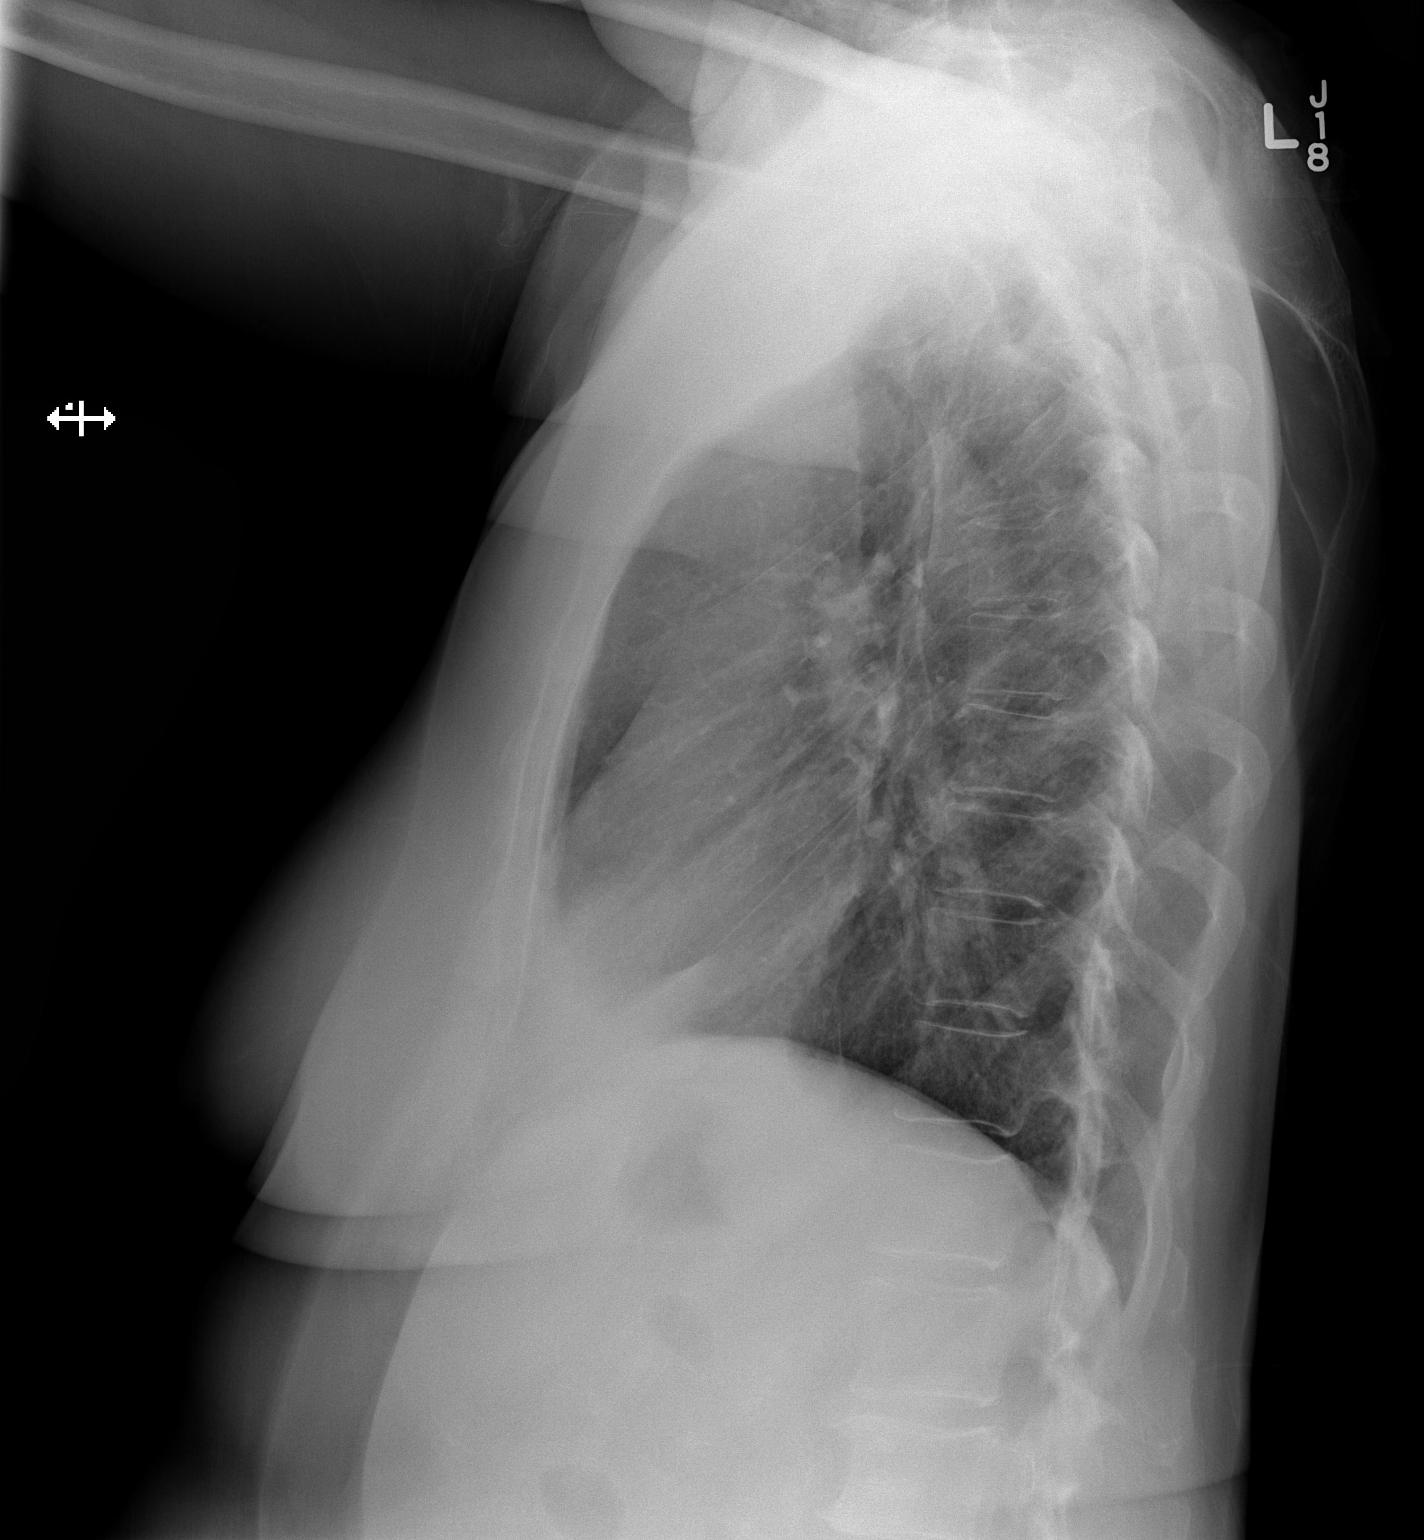

[2 of 2 positions shown; findings below may reference images not displayed]

FINDINGS: Normal mediastinum and cardiac silhouette. Normal pulmonary
vasculature. No evidence of effusion, infiltrate, or pneumothorax.
No acute bony abnormality.
IMPRESSION: No acute cardiopulmonary process.

## 2014-04-14 IMAGING — DX DG LUMBAR SPINE 1V
1 series · 1 of 1 positions shown · non-contrast
Comparison: 06/19/2013

CLINICAL DATA: Discectomy

EXAM:
LUMBAR SPINE - 1 VIEW

[lat]
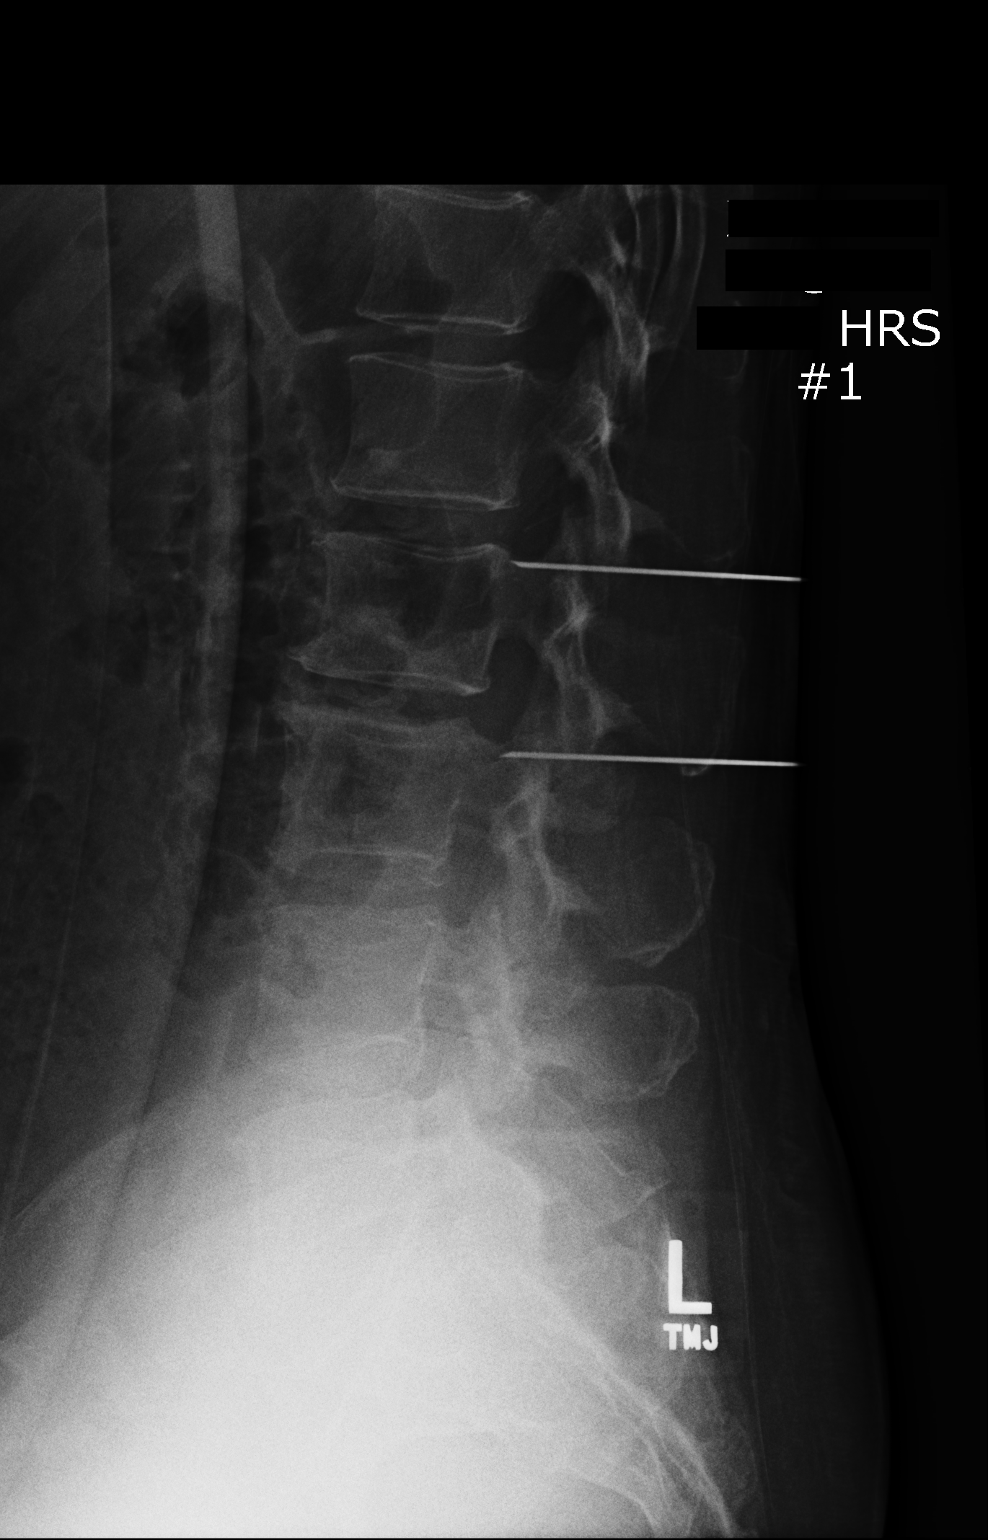

[1 of 1 positions shown; findings below may reference images not displayed]

FINDINGS: Single lateral radiograph is submitted.

When correlating with prior studies, there are vestigial ribs at T12
with 5 lumbar type vertebral bodies.

Surgical probe posterior to the superior endplate of L2.

Additional surgical probe posterior to the superior endplate of L3.
IMPRESSION: Lumbar localization as above.

## 2018-07-23 ENCOUNTER — Ambulatory Visit: Payer: BC Managed Care – PPO | Admitting: Cardiology

## 2020-10-22 ENCOUNTER — Ambulatory Visit (INDEPENDENT_AMBULATORY_CARE_PROVIDER_SITE_OTHER): Payer: Medicare Other

## 2020-10-22 ENCOUNTER — Ambulatory Visit (INDEPENDENT_AMBULATORY_CARE_PROVIDER_SITE_OTHER): Payer: Medicare Other | Admitting: Podiatry

## 2020-10-22 ENCOUNTER — Other Ambulatory Visit: Payer: Self-pay

## 2020-10-22 DIAGNOSIS — M79671 Pain in right foot: Secondary | ICD-10-CM

## 2020-10-22 DIAGNOSIS — M79672 Pain in left foot: Secondary | ICD-10-CM

## 2020-10-22 DIAGNOSIS — M722 Plantar fascial fibromatosis: Secondary | ICD-10-CM | POA: Diagnosis not present

## 2020-10-22 MED ORDER — DICLOFENAC SODIUM 75 MG PO TBEC: 75.0000 mg | DELAYED_RELEASE_TABLET | Freq: Two times a day (BID) | ORAL | 2 refills | Status: AC

## 2020-10-22 MED ORDER — TRIAMCINOLONE ACETONIDE 10 MG/ML IJ SUSP
10.0000 mg | Freq: Once | INTRAMUSCULAR | Status: AC
  Administered 2020-10-22: 10 mg

## 2020-10-22 NOTE — Patient Instructions (Signed)

## 2020-10-22 NOTE — Progress Notes (Signed)
Subjective:   Patient ID: Rhonda Ellis, female   DOB: 75 y.o.   MRN: [id number]   HPI Patient presents stating she has had a lot of pain in her left heel and states that she did go to another doctor who did an injection about 6 months ago and it helped for a short period of time and she has had a splint but she has not been using it as its not comfortable.  Patient does not smoke likes to be active   Review of Systems  All other systems reviewed and are negative.       Objective:  Physical Exam Vitals and nursing note reviewed.  Constitutional:      Appearance: She is well-developed and well-nourished.  Cardiovascular:     Pulses: Intact distal pulses.  Pulmonary:     Effort: Pulmonary effort is normal.  Musculoskeletal:        General: Normal range of motion.  Skin:    General: Skin is warm.  Neurological:     Mental Status: She is alert.     Neurovascular status found to be intact muscle strength was found to be adequate range of motion adequate.  Patient is found to have exquisite tenderness plantar fascial left over right heel with inflammation fluid in the medial band and has moderate depression of the arch with good digital perfusion well oriented x3     Assessment:  Acute Planter fasciitis left its not so far responded conservatively with mild pain right     Plan:  H&P x-rays reviewed sterile prep done injected the fascia at insertion 3 mg Kenalog 5 mg liken left acquired fascial brace left placed on diclofenac 75 mg twice daily gave shoe gear modification and choices and also information about stretching exercises.  Reappoint 4 weeks to recheck  X-rays indicate spur formation no indication stress fracture arthritis

## 2020-11-05 ENCOUNTER — Other Ambulatory Visit: Payer: Self-pay

## 2020-11-05 ENCOUNTER — Ambulatory Visit (INDEPENDENT_AMBULATORY_CARE_PROVIDER_SITE_OTHER): Payer: Medicare Other | Admitting: Podiatry

## 2020-11-05 ENCOUNTER — Encounter: Payer: Self-pay | Admitting: Podiatry

## 2020-11-05 DIAGNOSIS — M722 Plantar fascial fibromatosis: Secondary | ICD-10-CM | POA: Diagnosis not present

## 2020-11-05 MED ORDER — TRIAMCINOLONE ACETONIDE 10 MG/ML IJ SUSP
10.0000 mg | Freq: Once | INTRAMUSCULAR | Status: AC
  Administered 2020-11-05: 10 mg

## 2020-11-06 NOTE — Progress Notes (Signed)
Subjective:   Patient ID: Rhonda Ellis, female   DOB: 75 y.o.   MRN: [id number]   HPI Patient points stating that she is having quite a bit of discomfort in the left heel at the insertional point tendon calcaneus with inflammation fluid buildup   ROS      Objective:  Physical Exam  Pain to palpation left plantar heel with inflammation fluid medial band     Assessment:  Acute plantar fasciitis left with inflammation fluid     Plan:  Reinjected after sterile prep 3 mg Dexasone Kenalog 5 mg Xylocaine continue physical therapy supportive shoes and reappoint if symptoms indicate

## 2022-05-17 ENCOUNTER — Telehealth: Payer: Self-pay

## 2022-05-17 NOTE — Telephone Encounter (Signed)
NOTES SCANNED TO REFERRAL 

## 2022-05-26 NOTE — Progress Notes (Signed)
Cardiology Office Note:    Date:  06/03/2022   ID:  Rhonda, Ellis Jan 20, 1946, MRN [id number]  PCP:  Aletha Halim PA-C   [place] Providers Cardiologist:  None     Referring MD: Aletha Halim., PA-C   Chief Complaint  Patient presents with   New Patient (Initial Visit)        Chest Pain    History of Present Illness:    Rhonda Ellis is a 76 y.o. female seen at the request of Bing Matter PA-C for evaluation of chest pain. I follow her husband [name]. She has a history of HTN, mild hypercholesterolemia and family history of early CAD. She reports that last month she was under a lot of stress and developed chest pain localized to the lower mid anterior chest. No radiation. No associated SOB or nausea. Took antacid therapy with no improvement. Symptoms lasted several hours. Described pain as a dull ache. Has not had pain like that since. No prior cardiac evaluation.   Past Medical History:  Diagnosis Date   Colon cancer ([place])    Hypertension    Shortness of breath on exertion    also reports that she has had some chest pain with excertion   Uterine cancer [facility])     Past Surgical History:  Procedure Laterality Date   ABDOMINAL HYSTERECTOMY     APPENDECTOMY     LAMINOTOMY  07/24/2013   L4  L5        Dr Rolena Infante   LUMBAR LAMINECTOMY/DECOMPRESSION MICRODISCECTOMY Right 07/24/2013   Procedure: RIGHT L4-L5 DISCECTOMY 1 LEVEL;  Surgeon: Melina Schools, MD;  Location: [place];  Service: Orthopedics;  Laterality: Right;   PARTIAL COLECTOMY     TUBAL LIGATION      Current Medications: Current Meds  Medication Sig   ALPRAZolam (XANAX) 0.25 MG tablet Take one pill per day as needed   Cholecalciferol 25 MCG (1000 UT) tablet Take by mouth.   diclofenac (VOLTAREN) 75 MG EC tablet Take 1 tablet (75 mg total) by mouth 2 (two) times daily.   Diosmin-Hesperidin-Grape Seed (VENALIV) 900-100-100 MG TABS Take by mouth.   GRAPE SEED EXTRACT PO Take 1 capsule by  mouth daily.   HYDROcodone-acetaminophen (NORCO) 10-325 MG per tablet Take 1 tablet by mouth every 6 (six) hours as needed for pain.   hydrOXYzine (ATARAX) 10 MG tablet Take 1 tablet (10 mg total) by mouth 3 (three) times daily as needed.   lisinopril (ZESTRIL) 10 MG tablet Take 1 tablet (10 mg total) by mouth daily.   metoprolol tartrate (LOPRESSOR) 100 MG tablet Take 100 mg 2 hours before Coronary CT   Multiple Vitamins-Minerals (MULTIVITAMIN WITH MINERALS) tablet Take 1 tablet by mouth daily.   polyethylene glycol powder (GLYCOLAX) powder Take 17 g by mouth daily.   zolpidem (AMBIEN) 5 MG tablet Take 5 mg by mouth at bedtime as needed for sleep.   [DISCONTINUED] losartan (COZAAR) 25 MG tablet Take 25 mg by mouth daily.   [DISCONTINUED] methocarbamol (ROBAXIN) 500 MG tablet Take 1 tablet (500 mg total) by mouth 3 (three) times daily as needed.   [DISCONTINUED] ondansetron (ZOFRAN) 4 MG tablet Take 1 tablet (4 mg total) by mouth every 8 (eight) hours as needed for nausea.     Allergies:   Patient has no known allergies.   Social History   Socioeconomic History   Marital status: Married    Spouse name: Not on file   Number of children: 3  Years of education: Not on file   Highest education level: Not on file  Occupational History   Not on file  Tobacco Use   Smoking status: Former    Packs/day: 1.00    Years: 25.00    Total pack years: 25.00    Types: Cigarettes   Smokeless tobacco: Former    Quit date: 10/04/1987  Substance and Sexual Activity   Alcohol use: No   Drug use: No   Sexual activity: Not on file  Other Topics Concern   Not on file  Social History Narrative   Not on file   Social Determinants of Health   Financial Resource Strain: Not on file  Food Insecurity: Not on file  Transportation Needs: Not on file  Physical Activity: Not on file  Stress: Not on file  Social Connections: Not on file     Family History: The patient's family history includes Heart  attack (age of onset: 65) in her father; Heart attack (age of onset: 87) in her brother.  ROS:   Please see the history of present illness.     All other systems reviewed and are negative.  EKGs/Labs/Other Studies Reviewed:    The following studies were reviewed today: none  EKG:  EKG is ordered today.  The ekg ordered today demonstrates NSR rate 69. Poor R wave progression in precordial leads. I have personally reviewed and interpreted this study.   Recent Labs: No results found for requested labs within last 365 days.  Recent Lipid Panel No results found for: "CHOL", "TRIG", "HDL", "CHOLHDL", "VLDL", "LDLCALC", "LDLDIRECT"  Dated 09/02/21: cholesterol 211, triglycerides 54, LDL 126, HDL 75.  05/16/22: sodium 130, otherwise CMET normal. TSH normal. CBC normal.   Risk Assessment/Calculations:                Physical Exam:    VS:  BP 132/74 (BP Location: Left Arm, Patient Position: Sitting, Cuff Size: Normal)   Pulse 69   Ht '5\' 3"'$  (1.6 m)   Wt 180 lb (81.6 kg)   BMI 31.89 kg/m     Wt Readings from Last 3 Encounters:  06/03/22 180 lb (81.6 kg)  07/22/13 178 lb 9.2 oz (81 kg)     GEN:  Well nourished, well developed in no acute distress HEENT: Normal NECK: No JVD; No carotid bruits LYMPHATICS: No lymphadenopathy CARDIAC: RRR, no murmurs, rubs, gallops RESPIRATORY:  Clear to auscultation without rales, wheezing or rhonchi  ABDOMEN: Soft, non-tender, non-distended MUSCULOSKELETAL:  No edema; No deformity  SKIN: Warm and dry NEUROLOGIC:  Alert and oriented x 3 PSYCHIATRIC:  Normal affect   ASSESSMENT:    1. Precordial pain   2. Hypercholesteremia   3. Primary hypertension    PLAN:    In order of problems listed above:  Precordial chest pain. Risk factors of HTN, HLD and family history of premature CAD. Recommend ischemic evaluation. Discussed options of stress testing vs coronary CTA. Will proceed with CTA.  HLD. Mild. Will await results of CTA. If she has  CAD will recommend statin therapy HTN controlled on lisinopril  Family history of premature CAD.            Medication Adjustments/Labs and Tests Ordered: Current medicines are reviewed at length with the patient today.  Concerns regarding medicines are outlined above.  Orders Placed This Encounter  Procedures   CT CORONARY MORPH W/CTA COR W/SCORE W/CA W/CM &/OR WO/CM   EKG 12-Lead   Meds ordered this encounter  Medications  metoprolol tartrate (LOPRESSOR) 100 MG tablet    Sig: Take 100 mg 2 hours before Coronary CT    Dispense:  1 tablet    Refill:  0    Patient Instructions  Medication Instructions:  Continue same medications   Lab Work: None ordered   Testing/Procedures: Coronary CT will be scheduled after approved by insurance    Follow instructions below     Follow-Up: At [facility], you and your health needs are our priority.  As part of our continuing mission to provide you with exceptional heart care, we have created designated Provider Care Teams.  These Care Teams include your primary Cardiologist (physician) and Advanced Practice Providers (APPs -  Physician Assistants and Nurse Practitioners) who all work together to provide you with the care you need, when you need it.  We recommend signing up for the patient portal called "MyChart".  Sign up information is provided on this After Visit Summary.  MyChart is used to connect with patients for Virtual Visits (Telemedicine).  Patients are able to view lab/test results, encounter notes, upcoming appointments, etc.  Non-urgent messages can be sent to your provider as well.   To learn more about what you can do with MyChart, go to [email or web address].    Your next appointment: After test      The format for your next appointment: Office   Provider:  Dr.Mico Spark       Your cardiac CT will be scheduled at one of the below locations:   [facility] [address] [place],  [place] [address] [phone]  [place] [address] [place], [place] [address] [phone]  If scheduled at [facility], please arrive at the [facility] and Children's Entrance (Entrance C2) of [facility] 30 minutes prior to test start time. You can use the FREE valet parking offered at entrance C (encouraged to control the heart rate for the test)  Proceed to the [facility] Radiology Department (first floor) to check-in and test prep.  All radiology patients and guests should use entrance C2 at [facility], accessed from [facility], even though the hospital's physical address listed is [address].    If scheduled at [facility], please arrive 15 mins early for check-in and test prep.  Please follow these instructions carefully (unless otherwise directed):    On the Night Before the Test: Be sure to Drink plenty of water. Do not consume any caffeinated/decaffeinated beverages or chocolate 12 hours prior to your test. Do not take any antihistamines 12 hours prior to your test.   On the Day of the Test: Drink plenty of water until 1 hour prior to the test. Do not eat any food 4 hours prior to the test. You may take your regular medications prior to the test.  Take metoprolol 100 mg two hours prior to test. FEMALES- please wear underwire-free bra if available, avoid dresses & tight clothing         After the Test: Drink plenty of water. After receiving IV contrast, you may experience a mild flushed feeling. This is normal. On occasion, you may experience a mild rash up to 24 hours after the test. This is not dangerous. If this occurs, you can take Benadryl 25 mg and increase your fluid intake. If you experience trouble breathing, this can be serious. If it is severe call 911 IMMEDIATELY. If it is mild, please  call our office.   We will call to schedule  your test 2-4 weeks out understanding that some insurance companies will need an authorization prior to the service being performed.   For non-scheduling related questions, please contact the cardiac imaging nurse navigator should you have any questions/concerns: Marchia Bond, Cardiac Imaging Nurse Navigator Gordy Clement, Cardiac Imaging Nurse Navigator [facility] Direct Office Dial: [phone]   For scheduling needs, including cancellations and rescheduling, please call [place], [phone].  Important Information About Sugar         Signed, Margarit Minshall [place], MD  06/03/2022 12:04 PM    [place]

## 2022-06-03 ENCOUNTER — Ambulatory Visit: Payer: Medicare PPO | Attending: Cardiology | Admitting: Cardiology

## 2022-06-03 ENCOUNTER — Encounter: Payer: Self-pay | Admitting: Cardiology

## 2022-06-03 VITALS — BP 132/74 | HR 69 | Ht 63.0 in | Wt 180.0 lb

## 2022-06-03 DIAGNOSIS — E7800 Pure hypercholesterolemia, unspecified: Secondary | ICD-10-CM

## 2022-06-03 DIAGNOSIS — I10 Essential (primary) hypertension: Secondary | ICD-10-CM

## 2022-06-03 DIAGNOSIS — R072 Precordial pain: Secondary | ICD-10-CM

## 2022-06-03 MED ORDER — METOPROLOL TARTRATE 100 MG PO TABS: ORAL_TABLET | ORAL | 0 refills | Status: DC

## 2022-06-03 NOTE — Patient Instructions (Addendum)
Medication Instructions:  Continue same medications   Lab Work: None ordered   Testing/Procedures: Coronary CT will be scheduled after approved by insurance    Follow instructions below     Follow-Up: At [facility], you and your health needs are our priority.  As part of our continuing mission to provide you with exceptional heart care, we have created designated Provider Care Teams.  These Care Teams include your primary Cardiologist (physician) and Advanced Practice Providers (APPs -  Physician Assistants and Nurse Practitioners) who all work together to provide you with the care you need, when you need it.  We recommend signing up for the patient portal called "MyChart".  Sign up information is provided on this After Visit Summary.  MyChart is used to connect with patients for Virtual Visits (Telemedicine).  Patients are able to view lab/test results, encounter notes, upcoming appointments, etc.  Non-urgent messages can be sent to your provider as well.   To learn more about what you can do with MyChart, go to [email or web address].    Your next appointment: After test      The format for your next appointment: Office   Provider:  Dr.Jordan       Your cardiac CT will be scheduled at one of the below locations:   [facility] [address] [place], [place] [address] [phone]  [place] [address] [place], [place] [address] [phone]  If scheduled at [facility], please arrive at the [facility] and Children's Entrance (Entrance C2) of [facility] 30 minutes prior to test start time. You can use the FREE valet parking offered at entrance C (encouraged to control the heart rate for the test)  Proceed to the [facility] Radiology Department (first floor) to check-in and test prep.  All radiology patients and guests should use entrance C2 at [facility], accessed  from [facility], even though the hospital's physical address listed is [address].    If scheduled at [facility], please arrive 15 mins early for check-in and test prep.  Please follow these instructions carefully (unless otherwise directed):    On the Night Before the Test: Be sure to Drink plenty of water. Do not consume any caffeinated/decaffeinated beverages or chocolate 12 hours prior to your test. Do not take any antihistamines 12 hours prior to your test.   On the Day of the Test: Drink plenty of water until 1 hour prior to the test. Do not eat any food 4 hours prior to the test. You may take your regular medications prior to the test.  Take metoprolol 100 mg two hours prior to test. FEMALES- please wear underwire-free bra if available, avoid dresses & tight clothing         After the Test: Drink plenty of water. After receiving IV contrast, you may experience a mild flushed feeling. This is normal. On occasion, you may experience a mild rash up to 24 hours after the test. This is not dangerous. If this occurs, you can take Benadryl 25 mg and increase your fluid intake. If you experience trouble breathing, this can be serious. If it is severe call 911 IMMEDIATELY. If it is mild, please call our office.   We will call to schedule your test 2-4 weeks out understanding that some insurance companies will need an authorization prior to the service being performed.   For non-scheduling related questions, please  contact the cardiac imaging nurse navigator should you have any questions/concerns: Marchia Bond, Cardiac Imaging Nurse Navigator Gordy Clement, Cardiac Imaging Nurse Navigator [facility] Direct Office Dial: [phone]   For scheduling needs, including cancellations and rescheduling, please call [place], [phone].  Important Information About Sugar

## 2022-06-22 ENCOUNTER — Telehealth (HOSPITAL_COMMUNITY): Payer: Self-pay | Admitting: *Deleted

## 2022-06-22 NOTE — Telephone Encounter (Signed)
Reaching out to patient to offer assistance regarding upcoming cardiac imaging study; pt verbalizes understanding of appt date/time, parking situation and where to check in, medications ordered, and verified current allergies; name and call back number provided for further questions should they arise  Rhonda Clement RN Navigator Cardiac Imaging [facility] [phone] office [phone] cell  Patient to take '100mg'$  metoprolol tartrate two hours prior to her cardiac CT scan. She is aware to arrive at 12:30pm.

## 2022-06-23 ENCOUNTER — Other Ambulatory Visit: Payer: Self-pay | Admitting: Cardiology

## 2022-06-23 ENCOUNTER — Ambulatory Visit (HOSPITAL_COMMUNITY)
Admission: RE | Admit: 2022-06-23 | Discharge: 2022-06-23 | Disposition: A | Discharge status: Home / Self Care | Payer: Medicare PPO | Source: Ambulatory Visit | Attending: Cardiology | Admitting: Cardiology

## 2022-06-23 ENCOUNTER — Ambulatory Visit (HOSPITAL_BASED_OUTPATIENT_CLINIC_OR_DEPARTMENT_OTHER)
Admission: RE | Admit: 2022-06-23 | Discharge: 2022-06-23 | Disposition: A | Discharge status: Home / Self Care | Payer: Medicare PPO | Source: Ambulatory Visit | Attending: Cardiology | Admitting: Cardiology

## 2022-06-23 DIAGNOSIS — R931 Abnormal findings on diagnostic imaging of heart and coronary circulation: Secondary | ICD-10-CM

## 2022-06-23 DIAGNOSIS — E7800 Pure hypercholesterolemia, unspecified: Secondary | ICD-10-CM

## 2022-06-23 DIAGNOSIS — R072 Precordial pain: Secondary | ICD-10-CM

## 2022-06-23 DIAGNOSIS — I10 Essential (primary) hypertension: Secondary | ICD-10-CM

## 2022-06-23 DIAGNOSIS — I2510 Atherosclerotic heart disease of native coronary artery without angina pectoris: Secondary | ICD-10-CM

## 2022-06-23 MED ORDER — IOHEXOL 350 MG/ML SOLN
100.0000 mL | Freq: Once | INTRAVENOUS | Status: AC | PRN
Start: 2022-06-23 — End: 2022-06-23
  Administered 2022-06-23: 100 mL via INTRAVENOUS

## 2022-06-23 MED ORDER — NITROGLYCERIN 0.4 MG SL SUBL
SUBLINGUAL_TABLET | SUBLINGUAL | Status: AC
  Filled 2022-06-23: qty 2

## 2022-06-23 MED ORDER — NITROGLYCERIN 0.4 MG SL SUBL
0.8000 mg | SUBLINGUAL_TABLET | Freq: Once | SUBLINGUAL | Status: AC
  Administered 2022-06-23: 0.8 mg via SUBLINGUAL

## 2022-06-27 ENCOUNTER — Other Ambulatory Visit: Payer: Self-pay

## 2022-06-27 MED ORDER — PANTOPRAZOLE SODIUM 40 MG PO TBEC: 40.0000 mg | DELAYED_RELEASE_TABLET | Freq: Every day | ORAL | 3 refills | Status: AC

## 2022-06-27 MED ORDER — ROSUVASTATIN CALCIUM 10 MG PO TABS: 10.0000 mg | ORAL_TABLET | Freq: Every day | ORAL | 6 refills | Status: DC

## 2022-07-08 ENCOUNTER — Ambulatory Visit: Payer: Medicare PPO | Admitting: Cardiology

## 2022-10-18 NOTE — Progress Notes (Signed)
Cardiology Office Note:    Date:  10/24/2022   ID:  Rhonda, Ellis 1945-10-31, MRN [id number]  PCP:  Aletha Halim PA-C   [place] Providers Cardiologist:  Arland Usery Martinique, MD     Referring MD: Aletha Halim., PA-C   Chief Complaint  Patient presents with   Coronary Artery Disease    History of Present Illness:    Rhonda Ellis is a 77 y.o. female seen for follow up evaluation of chest pain. I follow her husband Rhonda Ellis. She has a history of HTN, mild hypercholesterolemia and family history of early CAD. When seen this past fall she was under a lot of stress and developed chest pain localized to the lower mid anterior chest. No radiation. No associated SOB or nausea. Took antacid therapy with no improvement. Symptoms lasted several hours. Described pain as a dull ache. Has not had pain like that since. No prior cardiac evaluation. She underwent coronary CTA. Calcium score was 152. There was modest proximal RCA disease 40-60%. Otherwise nonobstructive disease. She was noted to have a moderate sliding hiatal hernia. She was started on Crestor and protonix.   On follow up today she is doing well. She states she is taking the protonix PRN now and hasn't had any more chest pain. She states she could not tolerate Crestor  10 mg daily with leg pain and cramps within the first week. She notes lisinopril caused her ankles to swell so she is now on Bystolic.   Past Medical History:  Diagnosis Date   Colon cancer ([place])    Hypertension    Shortness of breath on exertion    also reports that she has had some chest pain with excertion   Uterine cancer [facility])     Past Surgical History:  Procedure Laterality Date   ABDOMINAL HYSTERECTOMY     APPENDECTOMY     LAMINOTOMY  07/24/2013   L4  L5        Dr Rolena Infante   LUMBAR LAMINECTOMY/DECOMPRESSION MICRODISCECTOMY Right 07/24/2013   Procedure: RIGHT L4-L5 DISCECTOMY 1 LEVEL;  Surgeon: Melina Schools, MD;  Location: [place];   Service: Orthopedics;  Laterality: Right;   PARTIAL COLECTOMY     TUBAL LIGATION      Current Medications: Current Meds  Medication Sig   ascorbic acid (VITAMIN C) 500 MG tablet Take 1 tablet by mouth daily.   Cholecalciferol 25 MCG (1000 UT) tablet Take by mouth.   diclofenac (VOLTAREN) 75 MG EC tablet Take 1 tablet (75 mg total) by mouth 2 (two) times daily.   MAGNESIUM PO Magnesium   Multiple Vitamins-Minerals (MULTIVITAMIN WITH MINERALS) tablet Take 1 tablet by mouth daily.   nebivolol (BYSTOLIC) 2.5 MG tablet Take 2.5 mg by mouth daily.   pantoprazole (PROTONIX) 40 MG tablet Take 1 tablet (40 mg total) by mouth daily.   zolpidem (AMBIEN) 5 MG tablet Take 5 mg by mouth at bedtime as needed for sleep.     Allergies:   Amlodipine and Lisinopril   Social History   Socioeconomic History   Marital status: Married    Spouse name: Not on file   Number of children: 3   Years of education: Not on file   Highest education level: Not on file  Occupational History   Not on file  Tobacco Use   Smoking status: Former    Packs/day: 1.00    Years: 25.00    Total pack years: 25.00    Types: Cigarettes  Smokeless tobacco: Former    Quit date: 10/04/1987  Substance and Sexual Activity   Alcohol use: No   Drug use: No   Sexual activity: Not on file  Other Topics Concern   Not on file  Social History Narrative   Not on file   Social Determinants of Health   Financial Resource Strain: Not on file  Food Insecurity: Not on file  Transportation Needs: Not on file  Physical Activity: Not on file  Stress: Not on file  Social Connections: Not on file     Family History: The patient's family history includes Heart attack (age of onset: 44) in her father; Heart attack (age of onset: 76) in her brother.  ROS:   Please see the history of present illness.     All other systems reviewed and are negative.  EKGs/Labs/Other Studies Reviewed:    The following studies were reviewed  today: Coronary CTA 06/23/22:  OVER-READ INTERPRETATION  CT CHEST   The following report is an over-read performed by radiologist Dr. Sabino Dick [facility] on 06/23/2022. This over-read does not include interpretation of cardiac or coronary anatomy or pathology. The coronary CTA interpretation by the cardiologist is attached.   COMPARISON:  None.   FINDINGS: The visualized portions of the extracardiac vascular structures are unremarkable. Moderate size sliding-type hiatal hernia is noted. Visualized portion of upper abdomen is unremarkable. Visualized pulmonary parenchyma is unremarkable. Visualized skeleton is unremarkable.   IMPRESSION: Moderate size sliding-type hiatal hernia. No other definite abnormality seen involving the visualized extracardiac structures of the chest.     Electronically Signed   By: Marijo Conception M.D.   On: 06/23/2022 15:01    Addended by Sabino Dick, MD on 06/23/2022  3:04 PM    Study Result  Narrative & Impression  CLINICAL DATA:  This is a 77 year old female with anginal symptoms.   EXAM: Cardiac/Coronary  CTA   TECHNIQUE: The patient was scanned on a [employer].   FINDINGS: A 100 kV prospective scan was triggered in the descending thoracic aorta at 111 HU's. Axial non-contrast 3 mm slices were carried out through the heart. The data set was analyzed on a dedicated work station and scored using the [place]. Gantry rotation speed was 250 msecs and collimation was .6 mm. No beta blockade and 0.8 mg of sl NTG was given. The 3D data set was reconstructed in 5% intervals of the 67-82 % of the R-R cycle. Diastolic phases were analyzed on a dedicated work station using MPR, MIP and VRT modes. The patient received 80 cc of contrast.   Image Quality: Fair, motion artifact.   Aorta: Normal size.  No calcifications.  No dissection.   Aortic Valve:  Trileaflet.  No calcifications.   Coronary Arteries:   Normal coronary origin.  Right dominance.   RCA is a large dominant artery that gives rise to PDA and PLA. There is a moderate (50-69%) focal calcified plaque in the proximal RCA. Diffuse minimal (<24%) calcifications in the mid RCA. The distal LAD with no plaques.   Left main is a large artery that gives rise to LAD and LCX arteries.   LAD is a large vessel. Proximal LAD no plaque. Mid LAD with a small pocket of minimal calcification. The distal LAD with no plaques. D1 is a small caliber vessel which appears to have a soft moderate plaque in the proximal portion of the vessel. D2 and D3 with no plaques.  LCX is a non-dominant artery that gives rise to one large OM1 branch. There is no plaque.   Coronary Calcium Score:   Left main: 0   Left anterior descending artery: 1.09   Left circumflex artery: 0   Right coronary artery: 151   Total: 152   Percentile: 67   Other findings:   Normal pulmonary vein drainage into the left atrium.   Normal left atrial appendage without a thrombus.   Normal size of the pulmonary artery.   IMPRESSION: 1. Coronary calcium score of 151. This was 47 percentile for age and sex matched control.   2. Normal coronary origin with right dominance.   3. CAD-RADS 3. Moderate stenosis. Consider symptom-guided anti-ischemic pharmacotherapy as well as risk factor modification per guideline directed care. Additional analysis with CT FFR will be submitted.   The noncardiac portion of this study will be interpreted in separate report by the radiologist.   Electronically Signed: By: Berniece Salines D.O. On: 06/23/2022 14:39   dataset. Diagrammatic representation of the FFRct analysis is provided in a separate PDF document in PACS. This dictation was created using the PDF document and an interactive 3D model of the results. 3D model is not available in the EMR/PACS. Normal FFR range is >0.80. Indeterminate (grey) zone is 0.76-0.80.   1. Left  Main: FFR = 0.98   2. LAD: Proximal FFR = 0.99, mid FFR = 0.97, distal FFR = 0.90 3. LCX: Proximal FFR = 0.98, distal FFR = not analyzed 4. RCA:Not analyzed due to artifact.   IMPRESSION: 1.  CT FFR analysis showed no significant stenosis.   RECOMMENDATIONS: Guideline-directed medical therapy and aggressive risk factor modification for secondary prevention of coronary artery disease.     Electronically Signed   By: Berniece Salines D.O.   On: 06/23/2022 20:17  EKG:  EKG is not ordered today.    Recent Labs: No results found for requested labs within last 365 days.  Recent Lipid Panel No results found for: "CHOL", "TRIG", "HDL", "CHOLHDL", "VLDL", "LDLCALC", "LDLDIRECT"  Dated 09/02/21: cholesterol 211, triglycerides 54, LDL 126, HDL 75.  05/16/22: sodium 130, otherwise CMET normal. TSH normal. CBC normal.  Dated 09/06/22: CBC is normal. Sodium 131, potassium 5.1. otherwise CMET normal. Cholesterol 201, triglycerides 48, HDL 69, LDL 123.   Risk Assessment/Calculations:                Physical Exam:    VS:  BP 120/70 (BP Location: Left Arm, Patient Position: Sitting, Cuff Size: Large)   Pulse 68   Ht '5\' 3"'$  (1.6 m)   Wt 182 lb 3.2 oz (82.6 kg)   SpO2 97%   BMI 32.28 kg/m     Wt Readings from Last 3 Encounters:  10/24/22 182 lb 3.2 oz (82.6 kg)  06/03/22 180 lb (81.6 kg)  07/22/13 178 lb 9.2 oz (81 kg)     GEN:  Well nourished, well developed in no acute distress HEENT: Normal NECK: No JVD; No carotid bruits LYMPHATICS: No lymphadenopathy CARDIAC: RRR, no murmurs, rubs, gallops RESPIRATORY:  Clear to auscultation without rales, wheezing or rhonchi  ABDOMEN: Soft, non-tender, non-distended MUSCULOSKELETAL:  No edema; No deformity  SKIN: Warm and dry NEUROLOGIC:  Alert and oriented x 3 PSYCHIATRIC:  Normal affect   ASSESSMENT:    1. Hypercholesteremia   2. Coronary artery disease involving native coronary artery of native heart without angina pectoris      PLAN:    In order of problems listed above:  CAD - Risk factors of HTN, HLD and family history of premature CAD. Fortunately coronary CTA showed nonobstructive CAD. She does have a moderate hiatal hernia which may be the source of her pain. With her CAD need to focus on optimal risk factor modification HTN controlled on bystolic Family history of premature CAD.  Hypercholesterolemia. With CAD would like to see LDL < 70. Will have her try taking Crestor 5 mg every other day. If unable to tolerate this will refer her to lipid clinic  I will follow up in 6 months           Medication Adjustments/Labs and Tests Ordered: Current medicines are reviewed at length with the patient today.  Concerns regarding medicines are outlined above.  No orders of the defined types were placed in this encounter.  Meds ordered this encounter  Medications   rosuvastatin (CRESTOR) 10 MG tablet    Sig: Take 0.5 tablets (5 mg total) by mouth every other day.    Dispense:  30 tablet    Refill:  6    Patient Instructions  Medication Instructions:  Your physician has recommended you make the following change in your medication:  DECREASE: Crestor 5 mg every other day *If you need a refill on your cardiac medications before your next appointment, please call your pharmacy*   Lab Work: None  Testing/Procedures: None   Follow-Up: At [facility], you and your health needs are our priority.  As part of our continuing mission to provide you with exceptional heart care, we have created designated Provider Care Teams.  These Care Teams include your primary Cardiologist (physician) and Advanced Practice Providers (APPs -  Physician Assistants and Nurse Practitioners) who all work together to provide you with the care you need, when you need it.  We recommend signing up for the patient portal called "MyChart".  Sign up information is provided on this After Visit Summary.  MyChart is used to  connect with patients for Virtual Visits (Telemedicine).  Patients are able to view lab/test results, encounter notes, upcoming appointments, etc.  Non-urgent messages can be sent to your provider as well.   To learn more about what you can do with MyChart, go to [email or web address].    Your next appointment:   6 month(s)  Provider:   Emad Brechtel Martinique, MD        Signed, Jonnie Truxillo Martinique, MD  10/24/2022 4:27 PM    [place]

## 2022-10-24 ENCOUNTER — Encounter: Payer: Self-pay | Admitting: Cardiology

## 2022-10-24 ENCOUNTER — Ambulatory Visit: Payer: Medicare PPO | Attending: Cardiology | Admitting: Cardiology

## 2022-10-24 VITALS — BP 120/70 | HR 68 | Ht 63.0 in | Wt 182.2 lb

## 2022-10-24 DIAGNOSIS — E7800 Pure hypercholesterolemia, unspecified: Secondary | ICD-10-CM

## 2022-10-24 DIAGNOSIS — I2510 Atherosclerotic heart disease of native coronary artery without angina pectoris: Secondary | ICD-10-CM | POA: Diagnosis not present

## 2022-10-24 MED ORDER — ROSUVASTATIN CALCIUM 10 MG PO TABS: 5.0000 mg | ORAL_TABLET | ORAL | 6 refills | Status: AC

## 2022-10-24 NOTE — Patient Instructions (Addendum)
Medication Instructions:  Your physician has recommended you make the following change in your medication:  DECREASE: Crestor 5 mg every other day *If you need a refill on your cardiac medications before your next appointment, please call your pharmacy*   Lab Work: None  Testing/Procedures: None   Follow-Up: At [facility], you and your health needs are our priority.  As part of our continuing mission to provide you with exceptional heart care, we have created designated Provider Care Teams.  These Care Teams include your primary Cardiologist (physician) and Advanced Practice Providers (APPs -  Physician Assistants and Nurse Practitioners) who all work together to provide you with the care you need, when you need it.  We recommend signing up for the patient portal called "MyChart".  Sign up information is provided on this After Visit Summary.  MyChart is used to connect with patients for Virtual Visits (Telemedicine).  Patients are able to view lab/test results, encounter notes, upcoming appointments, etc.  Non-urgent messages can be sent to your provider as well.   To learn more about what you can do with MyChart, go to [email or web address].    Your next appointment:   6 month(s)  Provider:   Mirko Tailor Martinique, MD

## 2023-06-09 ENCOUNTER — Other Ambulatory Visit: Payer: Self-pay | Admitting: Family Medicine

## 2023-06-09 ENCOUNTER — Ambulatory Visit
Admission: RE | Admit: 2023-06-09 | Discharge: 2023-06-09 | Disposition: A | Discharge status: Home / Self Care | Payer: Medicare PPO | Source: Ambulatory Visit | Attending: Family Medicine | Admitting: Family Medicine

## 2023-06-09 DIAGNOSIS — M7989 Other specified soft tissue disorders: Secondary | ICD-10-CM

## 2023-06-09 DIAGNOSIS — M79671 Pain in right foot: Secondary | ICD-10-CM
